# Patient Record
Sex: Male | Born: 1945 | Race: White | Hispanic: No | Marital: Single | Smoking: Former smoker
Health system: Southern US, Community
[De-identification: ages and names within clinical notes are randomized; demographics above are authoritative.]

## PROBLEM LIST (undated history)

## (undated) DIAGNOSIS — J189 Pneumonia, unspecified organism: Secondary | ICD-10-CM

## (undated) DIAGNOSIS — N179 Acute kidney failure, unspecified: Secondary | ICD-10-CM

## (undated) DIAGNOSIS — I10 Essential (primary) hypertension: Secondary | ICD-10-CM

## (undated) DIAGNOSIS — I4891 Unspecified atrial fibrillation: Secondary | ICD-10-CM

## (undated) DIAGNOSIS — Z5189 Encounter for other specified aftercare: Secondary | ICD-10-CM

## (undated) HISTORY — PX: APPENDECTOMY: SHX54

---

## 2016-10-24 ENCOUNTER — Inpatient Hospital Stay (HOSPITAL_COMMUNITY)
Admission: EM | Admit: 2016-10-24 | Discharge: 2016-10-28 | DRG: 291 | Disposition: A | Discharge status: Home / Self Care | Payer: Medicare Other | Attending: Internal Medicine | Admitting: Internal Medicine

## 2016-10-24 ENCOUNTER — Encounter (HOSPITAL_COMMUNITY): Payer: Self-pay | Admitting: Emergency Medicine

## 2016-10-24 DIAGNOSIS — N183 Chronic kidney disease, stage 3 unspecified: Secondary | ICD-10-CM | POA: Diagnosis present

## 2016-10-24 DIAGNOSIS — E039 Hypothyroidism, unspecified: Secondary | ICD-10-CM | POA: Diagnosis present

## 2016-10-24 DIAGNOSIS — G629 Polyneuropathy, unspecified: Secondary | ICD-10-CM | POA: Diagnosis present

## 2016-10-24 DIAGNOSIS — I509 Heart failure, unspecified: Secondary | ICD-10-CM

## 2016-10-24 DIAGNOSIS — Z6833 Body mass index (BMI) 33.0-33.9, adult: Secondary | ICD-10-CM

## 2016-10-24 DIAGNOSIS — N400 Enlarged prostate without lower urinary tract symptoms: Secondary | ICD-10-CM | POA: Diagnosis present

## 2016-10-24 DIAGNOSIS — E669 Obesity, unspecified: Secondary | ICD-10-CM | POA: Diagnosis present

## 2016-10-24 DIAGNOSIS — I080 Rheumatic disorders of both mitral and aortic valves: Secondary | ICD-10-CM | POA: Diagnosis present

## 2016-10-24 DIAGNOSIS — I130 Hypertensive heart and chronic kidney disease with heart failure and stage 1 through stage 4 chronic kidney disease, or unspecified chronic kidney disease: Secondary | ICD-10-CM | POA: Diagnosis not present

## 2016-10-24 DIAGNOSIS — R5381 Other malaise: Secondary | ICD-10-CM | POA: Diagnosis present

## 2016-10-24 DIAGNOSIS — Z882 Allergy status to sulfonamides status: Secondary | ICD-10-CM

## 2016-10-24 DIAGNOSIS — I4891 Unspecified atrial fibrillation: Secondary | ICD-10-CM | POA: Diagnosis present

## 2016-10-24 DIAGNOSIS — Z79899 Other long term (current) drug therapy: Secondary | ICD-10-CM

## 2016-10-24 DIAGNOSIS — K219 Gastro-esophageal reflux disease without esophagitis: Secondary | ICD-10-CM | POA: Diagnosis present

## 2016-10-24 DIAGNOSIS — I480 Paroxysmal atrial fibrillation: Secondary | ICD-10-CM | POA: Diagnosis present

## 2016-10-24 DIAGNOSIS — D649 Anemia, unspecified: Secondary | ICD-10-CM | POA: Diagnosis present

## 2016-10-24 DIAGNOSIS — Z888 Allergy status to other drugs, medicaments and biological substances status: Secondary | ICD-10-CM

## 2016-10-24 DIAGNOSIS — A0472 Enterocolitis due to Clostridium difficile, not specified as recurrent: Secondary | ICD-10-CM | POA: Diagnosis present

## 2016-10-24 DIAGNOSIS — Z7901 Long term (current) use of anticoagulants: Secondary | ICD-10-CM

## 2016-10-24 DIAGNOSIS — N179 Acute kidney failure, unspecified: Secondary | ICD-10-CM | POA: Diagnosis present

## 2016-10-24 DIAGNOSIS — I10 Essential (primary) hypertension: Secondary | ICD-10-CM | POA: Diagnosis present

## 2016-10-24 DIAGNOSIS — I272 Pulmonary hypertension, unspecified: Secondary | ICD-10-CM | POA: Diagnosis present

## 2016-10-24 DIAGNOSIS — I5031 Acute diastolic (congestive) heart failure: Secondary | ICD-10-CM | POA: Diagnosis present

## 2016-10-24 DIAGNOSIS — D509 Iron deficiency anemia, unspecified: Secondary | ICD-10-CM | POA: Diagnosis present

## 2016-10-24 HISTORY — DX: Encounter for other specified aftercare: Z51.89

## 2016-10-24 HISTORY — DX: Essential (primary) hypertension: I10

## 2016-10-24 HISTORY — DX: Unspecified atrial fibrillation: I48.91

## 2016-10-24 HISTORY — DX: Pneumonia, unspecified organism: J18.9

## 2016-10-24 HISTORY — DX: Acute kidney failure, unspecified: N17.9

## 2016-10-24 MED ORDER — ALBUTEROL SULFATE (2.5 MG/3ML) 0.083% IN NEBU: 5.0000 mg | INHALATION_SOLUTION | Freq: Once | RESPIRATORY_TRACT | Status: DC

## 2016-10-24 NOTE — ED Triage Notes (Signed)
Patient is complaining of shortness of breathe. Patient just got out of rehab on 09/17 from having pneumonia. Patient also has a hx of afib. Patient started having shortness of breathe last night and gotten worse over the course of the day.

## 2016-10-25 ENCOUNTER — Emergency Department (HOSPITAL_COMMUNITY): Payer: Medicare Other

## 2016-10-25 ENCOUNTER — Inpatient Hospital Stay (HOSPITAL_COMMUNITY): Payer: Medicare Other

## 2016-10-25 ENCOUNTER — Encounter (HOSPITAL_COMMUNITY): Payer: Self-pay | Admitting: Oncology

## 2016-10-25 DIAGNOSIS — I130 Hypertensive heart and chronic kidney disease with heart failure and stage 1 through stage 4 chronic kidney disease, or unspecified chronic kidney disease: Secondary | ICD-10-CM | POA: Diagnosis present

## 2016-10-25 DIAGNOSIS — E039 Hypothyroidism, unspecified: Secondary | ICD-10-CM | POA: Diagnosis present

## 2016-10-25 DIAGNOSIS — D649 Anemia, unspecified: Secondary | ICD-10-CM | POA: Diagnosis present

## 2016-10-25 DIAGNOSIS — E669 Obesity, unspecified: Secondary | ICD-10-CM | POA: Diagnosis present

## 2016-10-25 DIAGNOSIS — N179 Acute kidney failure, unspecified: Secondary | ICD-10-CM | POA: Diagnosis present

## 2016-10-25 DIAGNOSIS — N183 Chronic kidney disease, stage 3 unspecified: Secondary | ICD-10-CM | POA: Diagnosis present

## 2016-10-25 DIAGNOSIS — E038 Other specified hypothyroidism: Secondary | ICD-10-CM | POA: Diagnosis not present

## 2016-10-25 DIAGNOSIS — I5031 Acute diastolic (congestive) heart failure: Secondary | ICD-10-CM | POA: Diagnosis not present

## 2016-10-25 DIAGNOSIS — Z888 Allergy status to other drugs, medicaments and biological substances status: Secondary | ICD-10-CM | POA: Diagnosis not present

## 2016-10-25 DIAGNOSIS — Z79899 Other long term (current) drug therapy: Secondary | ICD-10-CM | POA: Diagnosis not present

## 2016-10-25 DIAGNOSIS — I4891 Unspecified atrial fibrillation: Secondary | ICD-10-CM | POA: Diagnosis not present

## 2016-10-25 DIAGNOSIS — I361 Nonrheumatic tricuspid (valve) insufficiency: Secondary | ICD-10-CM

## 2016-10-25 DIAGNOSIS — G629 Polyneuropathy, unspecified: Secondary | ICD-10-CM

## 2016-10-25 DIAGNOSIS — R5381 Other malaise: Secondary | ICD-10-CM | POA: Diagnosis present

## 2016-10-25 DIAGNOSIS — Z882 Allergy status to sulfonamides status: Secondary | ICD-10-CM | POA: Diagnosis not present

## 2016-10-25 DIAGNOSIS — I080 Rheumatic disorders of both mitral and aortic valves: Secondary | ICD-10-CM | POA: Diagnosis present

## 2016-10-25 DIAGNOSIS — I10 Essential (primary) hypertension: Secondary | ICD-10-CM | POA: Diagnosis present

## 2016-10-25 DIAGNOSIS — I480 Paroxysmal atrial fibrillation: Secondary | ICD-10-CM | POA: Diagnosis present

## 2016-10-25 DIAGNOSIS — I509 Heart failure, unspecified: Secondary | ICD-10-CM

## 2016-10-25 DIAGNOSIS — A0472 Enterocolitis due to Clostridium difficile, not specified as recurrent: Secondary | ICD-10-CM | POA: Diagnosis present

## 2016-10-25 DIAGNOSIS — D509 Iron deficiency anemia, unspecified: Secondary | ICD-10-CM | POA: Diagnosis present

## 2016-10-25 DIAGNOSIS — K219 Gastro-esophageal reflux disease without esophagitis: Secondary | ICD-10-CM | POA: Diagnosis present

## 2016-10-25 DIAGNOSIS — N400 Enlarged prostate without lower urinary tract symptoms: Secondary | ICD-10-CM | POA: Diagnosis present

## 2016-10-25 DIAGNOSIS — Z6833 Body mass index (BMI) 33.0-33.9, adult: Secondary | ICD-10-CM | POA: Diagnosis not present

## 2016-10-25 DIAGNOSIS — Z7901 Long term (current) use of anticoagulants: Secondary | ICD-10-CM | POA: Diagnosis not present

## 2016-10-25 DIAGNOSIS — I272 Pulmonary hypertension, unspecified: Secondary | ICD-10-CM | POA: Diagnosis present

## 2016-10-25 LAB — CBC WITH DIFFERENTIAL/PLATELET
BASOS ABS: 0 10*3/uL (ref 0.0–0.1)
BASOS PCT: 1 %
EOS ABS: 0 10*3/uL (ref 0.0–0.7)
Eosinophils Relative: 0 %
HEMATOCRIT: 25.4 % — AB (ref 39.0–52.0)
Hemoglobin: 8.2 g/dL — ABNORMAL LOW (ref 13.0–17.0)
Lymphocytes Relative: 13 %
Lymphs Abs: 1 10*3/uL (ref 0.7–4.0)
MCH: 29.7 pg (ref 26.0–34.0)
MCHC: 32.3 g/dL (ref 30.0–36.0)
MCV: 92 fL (ref 78.0–100.0)
MONO ABS: 0.7 10*3/uL (ref 0.1–1.0)
MONOS PCT: 9 %
NEUTROS ABS: 5.8 10*3/uL (ref 1.7–7.7)
Neutrophils Relative %: 77 %
PLATELETS: 153 10*3/uL (ref 150–400)
RBC: 2.76 MIL/uL — ABNORMAL LOW (ref 4.22–5.81)
RDW: 16.6 % — AB (ref 11.5–15.5)
WBC: 7.5 10*3/uL (ref 4.0–10.5)

## 2016-10-25 LAB — COMPREHENSIVE METABOLIC PANEL
ALBUMIN: 3.2 g/dL — AB (ref 3.5–5.0)
ALT: 50 U/L (ref 17–63)
ANION GAP: 5 (ref 5–15)
AST: 21 U/L (ref 15–41)
Alkaline Phosphatase: 98 U/L (ref 38–126)
BUN: 32 mg/dL — AB (ref 6–20)
CHLORIDE: 109 mmol/L (ref 101–111)
CO2: 28 mmol/L (ref 22–32)
Calcium: 8 mg/dL — ABNORMAL LOW (ref 8.9–10.3)
Creatinine, Ser: 1.49 mg/dL — ABNORMAL HIGH (ref 0.61–1.24)
GFR calc Af Amer: 53 mL/min — ABNORMAL LOW (ref >60)
GFR calc non Af Amer: 45 mL/min — ABNORMAL LOW (ref >60)
GLUCOSE: 146 mg/dL — AB (ref 65–99)
POTASSIUM: 4.6 mmol/L (ref 3.5–5.1)
SODIUM: 142 mmol/L (ref 135–145)
TOTAL PROTEIN: 7.4 g/dL (ref 6.5–8.1)
Total Bilirubin: 0.7 mg/dL (ref 0.3–1.2)

## 2016-10-25 LAB — MAGNESIUM: MAGNESIUM: 1.8 mg/dL (ref 1.7–2.4)

## 2016-10-25 LAB — I-STAT TROPONIN, ED: TROPONIN I, POC: 0.01 ng/mL (ref 0.00–0.08)

## 2016-10-25 LAB — ECHOCARDIOGRAM COMPLETE
HEIGHTINCHES: 68 in
Weight: 3661.4 oz

## 2016-10-25 LAB — BRAIN NATRIURETIC PEPTIDE: B Natriuretic Peptide: 799 pg/mL — ABNORMAL HIGH (ref 0.0–100.0)

## 2016-10-25 LAB — TSH: TSH: 4.788 u[IU]/mL — AB (ref 0.350–4.500)

## 2016-10-25 LAB — PROTIME-INR
INR: 2.3
INR: 2.46
PROTHROMBIN TIME: 25.1 s — AB (ref 11.4–15.2)
PROTHROMBIN TIME: 26.5 s — AB (ref 11.4–15.2)

## 2016-10-25 LAB — I-STAT CG4 LACTIC ACID, ED: Lactic Acid, Venous: 0.93 mmol/L (ref 0.5–1.9)

## 2016-10-25 LAB — PROCALCITONIN: Procalcitonin: <0.1 ng/mL

## 2016-10-25 MED ORDER — SODIUM CHLORIDE 0.9% FLUSH
3.0000 mL | Freq: Two times a day (BID) | INTRAVENOUS | Status: DC
  Administered 2016-10-25 – 2016-10-28 (×7): 3 mL via INTRAVENOUS

## 2016-10-25 MED ORDER — ZOLPIDEM TARTRATE 5 MG PO TABS
5.0000 mg | ORAL_TABLET | Freq: Once | ORAL | Status: AC
  Administered 2016-10-25: 5 mg via ORAL
  Filled 2016-10-25: qty 1

## 2016-10-25 MED ORDER — LEVOTHYROXINE SODIUM 25 MCG PO TABS: 75.0000 ug | ORAL_TABLET | Freq: Every day | ORAL | Status: DC

## 2016-10-25 MED ORDER — LEVOTHYROXINE SODIUM 50 MCG PO TABS
75.0000 ug | ORAL_TABLET | Freq: Every day | ORAL | Status: AC
  Filled 2016-10-25: qty 1

## 2016-10-25 MED ORDER — SODIUM CHLORIDE 0.9% FLUSH: 3.0000 mL | INTRAVENOUS | Status: DC | PRN

## 2016-10-25 MED ORDER — SODIUM CHLORIDE 0.9 % IV SOLN: 250.0000 mL | INTRAVENOUS | Status: DC | PRN

## 2016-10-25 MED ORDER — FUROSEMIDE 10 MG/ML IJ SOLN
40.0000 mg | Freq: Once | INTRAMUSCULAR | Status: AC
  Administered 2016-10-25: 40 mg via INTRAVENOUS
  Filled 2016-10-25: qty 4

## 2016-10-25 MED ORDER — WARFARIN - PHARMACIST DOSING INPATIENT: Freq: Every day | Status: DC

## 2016-10-25 MED ORDER — ONDANSETRON HCL 4 MG/2ML IJ SOLN: 4.0000 mg | Freq: Four times a day (QID) | INTRAMUSCULAR | Status: DC | PRN

## 2016-10-25 MED ORDER — LEVOTHYROXINE SODIUM 50 MCG PO TABS
75.0000 ug | ORAL_TABLET | Freq: Once | ORAL | Status: AC
  Administered 2016-10-25: 12:00:00 75 ug via ORAL

## 2016-10-25 MED ORDER — METOPROLOL TARTRATE 50 MG PO TABS
100.0000 mg | ORAL_TABLET | Freq: Two times a day (BID) | ORAL | Status: DC
  Administered 2016-10-25 – 2016-10-28 (×7): 100 mg via ORAL
  Filled 2016-10-25 (×8): qty 2

## 2016-10-25 MED ORDER — ZOLPIDEM TARTRATE 5 MG PO TABS
5.0000 mg | ORAL_TABLET | Freq: Every day | ORAL | Status: DC
  Administered 2016-10-25 – 2016-10-27 (×3): 5 mg via ORAL
  Filled 2016-10-25 (×3): qty 1

## 2016-10-25 MED ORDER — GABAPENTIN 100 MG PO CAPS
100.0000 mg | ORAL_CAPSULE | Freq: Three times a day (TID) | ORAL | Status: DC
  Administered 2016-10-25 – 2016-10-28 (×10): 100 mg via ORAL
  Filled 2016-10-25 (×10): qty 1

## 2016-10-25 MED ORDER — FERROUS SULFATE 325 (65 FE) MG PO TABS
325.0000 mg | ORAL_TABLET | ORAL | Status: DC
  Administered 2016-10-26: 325 mg via ORAL
  Filled 2016-10-25: qty 1

## 2016-10-25 MED ORDER — WARFARIN SODIUM 5 MG PO TABS
7.5000 mg | ORAL_TABLET | Freq: Once | ORAL | Status: AC
  Administered 2016-10-25: 7.5 mg via ORAL
  Filled 2016-10-25: qty 1

## 2016-10-25 MED ORDER — POTASSIUM CHLORIDE CRYS ER 20 MEQ PO TBCR
20.0000 meq | EXTENDED_RELEASE_TABLET | Freq: Two times a day (BID) | ORAL | Status: DC
  Administered 2016-10-25 – 2016-10-28 (×7): 20 meq via ORAL
  Filled 2016-10-25 (×7): qty 1

## 2016-10-25 MED ORDER — LEVOTHYROXINE SODIUM 50 MCG PO TABS
75.0000 ug | ORAL_TABLET | Freq: Every day | ORAL | Status: DC
  Administered 2016-10-25: 10:00:00 75 ug via ORAL
  Filled 2016-10-25 (×2): qty 1

## 2016-10-25 MED ORDER — LEVOTHYROXINE SODIUM 50 MCG PO TABS: 75.0000 ug | ORAL_TABLET | Freq: Every day | ORAL | Status: DC

## 2016-10-25 MED ORDER — RISAQUAD PO CAPS
1.0000 | ORAL_CAPSULE | Freq: Every day | ORAL | Status: DC
  Administered 2016-10-25 – 2016-10-28 (×4): 1 via ORAL
  Filled 2016-10-25 (×4): qty 1

## 2016-10-25 MED ORDER — FUROSEMIDE 10 MG/ML IJ SOLN
40.0000 mg | Freq: Two times a day (BID) | INTRAMUSCULAR | Status: DC
  Administered 2016-10-25 (×2): 40 mg via INTRAVENOUS
  Filled 2016-10-25 (×2): qty 4

## 2016-10-25 MED ORDER — ACETAMINOPHEN 325 MG PO TABS: 650.0000 mg | ORAL_TABLET | ORAL | Status: DC | PRN

## 2016-10-25 MED ORDER — PANTOPRAZOLE SODIUM 40 MG PO TBEC
40.0000 mg | DELAYED_RELEASE_TABLET | Freq: Every day | ORAL | Status: DC
  Administered 2016-10-26 – 2016-10-28 (×3): 40 mg via ORAL
  Filled 2016-10-25 (×4): qty 1

## 2016-10-25 MED ORDER — LEVOTHYROXINE SODIUM 50 MCG PO TABS
75.0000 ug | ORAL_TABLET | Freq: Every day | ORAL | Status: DC
  Administered 2016-10-26 – 2016-10-28 (×3): 75 ug via ORAL
  Filled 2016-10-25 (×3): qty 1

## 2016-10-25 NOTE — ED Provider Notes (Addendum)
[facility]-EMERGENCY DEPT Provider Note   CSN: [id number] Arrival date & time: 10/24/16  2305     History   Chief Complaint Chief Complaint  Patient presents with  . Shortness of Breath    HPI Alexander Camacho is a 71 y.o. male.  Patient presents to the emergency department for evaluation of shortness of breath.  Patient reports that he was recently discharged from rehab.  He went to rehab after a prolonged hospitalization for pneumonia that was complicated by atrial fibrillation and renal failure requiring dialysis.  Patient reports that his renal function significantly improved during rehab stay, but since leaving rehab he has been noticing increased swelling of his legs to go with his shortness of breath.  He has not had fever or chest pain.      Past Medical History:  Diagnosis Date  . Acute renal failure (HCC)   . Atrial fibrillation (HCC)   . Blood transfusion without reported diagnosis   . Hypertension   . Pneumonia     Patient Active Problem List   Diagnosis Date Noted  . CHF (congestive heart failure) (HCC) 10/25/2016    Past Surgical History:  Procedure Laterality Date  . APPENDECTOMY         Home Medications    Prior to Admission medications   Medication Sig Start Date End Date Taking? Authorizing Provider  acidophilus (RISAQUAD) CAPS capsule Take 1 capsule by mouth daily.   Yes [provider]  amLODipine (NORVASC) 5 MG tablet Take 5 mg by mouth daily.   Yes [provider]  doxazosin (CARDURA) 4 MG tablet Take 4 mg by mouth at bedtime.   Yes [provider]  ferrous sulfate 325 (65 FE) MG tablet Take 325 mg by mouth every Monday, Wednesday, Friday, and Saturday at 6 PM.   Yes [provider]  gabapentin (NEURONTIN) 100 MG capsule Take 100 mg by mouth 3 (three) times daily.   Yes [provider]  levothyroxine (SYNTHROID, LEVOTHROID) 75 MCG tablet Take 75 mcg by mouth daily  before breakfast.   Yes [provider]  metoprolol tartrate (LOPRESSOR) 100 MG tablet Take 100 mg by mouth 2 (two) times daily.   Yes [provider]  pantoprazole (PROTONIX) 40 MG tablet Take 40 mg by mouth daily.   Yes [provider]  polyethylene glycol (MIRALAX / GLYCOLAX) packet Take 17 g by mouth daily as needed for moderate constipation.   Yes [provider]  warfarin (COUMADIN) 7.5 MG tablet Take 7.5 mg by mouth daily at 6 PM.   Yes [provider]  zolpidem (AMBIEN) 10 MG tablet Take 10 mg by mouth at bedtime.   Yes [provider]    Family History History reviewed. No pertinent family history.  Social History Social History  Substance Use Topics  . Smoking status: Never Smoker  . Smokeless tobacco: Never Used  . Alcohol use No     Allergies   Lorazepam and Sulfa antibiotics   Review of Systems Review of Systems  Respiratory: Positive for shortness of breath.   Cardiovascular: Positive for leg swelling.  All other systems reviewed and are negative.    Physical Exam Updated Vital Signs BP (!) 146/84 (BP Location: Right Arm)   Pulse 100   Temp 98.6 F (37 C) (Oral)   Resp (!) 24   Ht 5\' 8"  (1.727 m)   Wt 106.6 kg (235 lb)   SpO2 93%   BMI 35.73 kg/m  Physical Exam  Constitutional: He is oriented to person, place, and time. He appears well-developed and well-nourished. No distress.  HENT:  Head: Normocephalic and atraumatic.  Right Ear: Hearing normal.  Left Ear: Hearing normal.  Nose: Nose normal.  Mouth/Throat: Oropharynx is clear and moist and mucous membranes are normal.  Eyes: Pupils are equal, round, and reactive to light. Conjunctivae and EOM are normal.  Neck: Normal range of motion. Neck supple.  Cardiovascular: Regular rhythm, S1 normal and S2 normal.  Exam reveals no gallop and no friction rub.   No murmur heard. Pulmonary/Chest: Effort normal. No respiratory distress. He has rales. He  exhibits no tenderness.  Abdominal: Soft. Normal appearance and bowel sounds are normal. There is no hepatosplenomegaly. There is no tenderness. There is no rebound, no guarding, no tenderness at McBurney's point and negative Murphy's sign. No hernia.  Musculoskeletal: Normal range of motion. He exhibits edema.  Neurological: He is alert and oriented to person, place, and time. He has normal strength. No cranial nerve deficit or sensory deficit. Coordination normal. GCS eye subscore is 4. GCS verbal subscore is 5. GCS motor subscore is 6.  Skin: Skin is warm, dry and intact. No rash noted. No cyanosis.  Psychiatric: He has a normal mood and affect. His speech is normal and behavior is normal. Thought content normal.  Nursing note and vitals reviewed.    ED Treatments / Results  Labs (all labs ordered are listed, but only abnormal results are displayed) Labs Reviewed  CBC WITH DIFFERENTIAL/PLATELET - Abnormal; Notable for the following:       Result Value   RBC 2.76 (*)    Hemoglobin 8.2 (*)    HCT 25.4 (*)    RDW 16.6 (*)    All other components within normal limits  COMPREHENSIVE METABOLIC PANEL - Abnormal; Notable for the following:    Glucose, Bld 146 (*)    BUN 32 (*)    Creatinine, Ser 1.49 (*)    Calcium 8.0 (*)    Albumin 3.2 (*)    GFR calc non Af Amer 45 (*)    GFR calc Af Amer 53 (*)    All other components within normal limits  BRAIN NATRIURETIC PEPTIDE - Abnormal; Notable for the following:    B Natriuretic Peptide 799.0 (*)    All other components within normal limits  PROTIME-INR - Abnormal; Notable for the following:    Prothrombin Time 25.1 (*)    All other components within normal limits  PROTIME-INR  I-STAT CG4 LACTIC ACID, ED  I-STAT TROPONIN, ED    EKG  EKG Interpretation  Date/Time:  Tuesday October 25 2016 00:18:58 EDT Ventricular Rate:  98 PR Interval:    QRS Duration: 87 QT Interval:  342 QTC Calculation: 423 R Axis:   0 Text Interpretation:   Atrial fibrillation Short PR interval Baseline wander in lead(s) I III aVL Confirmed by Gilda Creaseollina, Kaylinn Dedic J [phone]([id number]) on 10/25/2016 1:35:17 AM       Radiology Dg Chest 2 View  Result Date: 10/25/2016 CLINICAL DATA:  71 year old male with shortness of breath. EXAM: CHEST  2 VIEW COMPARISON:  None. FINDINGS: There are small bilateral pleural effusions, right greater than left. There is associated partial compressive atelectasis of the lower lobes. Superimposed pneumonia is not excluded. Clinical correlation is recommended. There is no pneumothorax. There is cardiomegaly. There is silhouetting of the cardiac border by the pleuroparenchymal densities. No acute osseous pathology. IMPRESSION: Bilateral pleural effusions and bilateral lower lobe atelectatic changes  versus infiltrate. Clinical correlation is recommended. Electronically Signed   By: Elgie Collard M.D.   On: 10/25/2016 00:30    Procedures Procedures (including critical care time)  Medications Ordered in ED Medications  albuterol (PROVENTIL) (2.5 MG/3ML) 0.083% nebulizer solution 5 mg (0 mg Nebulization Hold 10/25/16 0202)  levothyroxine (SYNTHROID, LEVOTHROID) tablet 75 mcg (not administered)  metoprolol tartrate (LOPRESSOR) tablet 100 mg (not administered)  warfarin (COUMADIN) tablet 7.5 mg (not administered)  Warfarin - Pharmacist Dosing Inpatient (not administered)  furosemide (LASIX) injection 40 mg (40 mg Intravenous Given 10/25/16 0340)     Initial Impression / Assessment and Plan / ED Course  I have reviewed the triage vital signs and the nursing notes.  Pertinent labs & imaging results that were available during my care of the patient were reviewed by me and considered in my medical decision making (see chart for details).     Patient presents to the emergency department for evaluation of shortness of breath.  Patient was just moved to the area after leaving rehab after a lengthy hospitalization in [place].   Patient was hospitalized for pneumonia, had a prolonged hospital course.  He was intubated for a period of time, had complications including atrial fibrillation and renal failure requiring dialysis.  Patient went to rehab after hospitalization and was just discharged.  During his rehab stay his renal failure significantly improved.  He has, however, been experiencing progressively increasing extremity edema since discharge from the hospital.  His workup today is consistent with congestive heart failure.  Patient administered IV Lasix here in the ER and will require hospitalization for further management.  Final Clinical Impressions(s) / ED Diagnoses   Final diagnoses:  Acute congestive heart failure, unspecified heart failure type [facility])    New Prescriptions New Prescriptions   No medications on file     Gilda Crease, MD 10/25/16 0502    Gilda Crease, MD 11/04/16 0400

## 2016-10-25 NOTE — Progress Notes (Signed)
ANTICOAGULATION CONSULT NOTE - Initial Consult  Pharmacy Consult for Warfarin Indication: atrial fibrillation  Allergies  Allergen Reactions  . Lorazepam Other (See Comments)    Altered mental status, confusion  . Sulfa Antibiotics Anaphylaxis    Patient Measurements: Height: 5\' 8"  (172.7 cm) Weight: 235 lb (106.6 kg) IBW/kg (Calculated) : 68.4  Vital Signs: Temp: 98.6 F (37 C) (10/23 0343) Temp Source: Oral (10/23 0343) BP: 146/84 (10/23 0343) Pulse Rate: 100 (10/23 0343)  Labs:  Recent Labs  10/25/16 0030 10/25/16 0033  HGB 8.2*  --   HCT 25.4*  --   PLT 153  --   LABPROT  --  25.1*  INR  --  2.30  CREATININE 1.49*  --     Estimated Creatinine Clearance: 53.8 mL/min (A) (by C-G formula based on SCr of 1.49 mg/dL (H)).   Medical History: Past Medical History:  Diagnosis Date  . Acute renal failure (HCC)   . Atrial fibrillation (HCC)   . Blood transfusion without reported diagnosis   . Hypertension   . Pneumonia     Medications:  Scheduled:  . albuterol  5 mg Nebulization Once  . levothyroxine  75 mcg Oral QAC breakfast  . metoprolol tartrate  100 mg Oral BID   Infusions:   PRN:   Assessment: 71 year old male on chronic warfarin for afib admitted with shortness of breath and lower extremity swelling.  Home dose of warfarin reported as 7.5mg  daily, INR on admission therapeutic at 2.3.  Pharmacy is consulted to continue dosing while inpatient.  Goal of Therapy:  INR 2-3 Monitor platelets by anticoagulation protocol: Yes   Plan:   Warfarin 7.5mg  PO x 1 tonight as per home regimen  Daily PT/INR  Loralee PacasErin Simona Rocque, PharmD, BCPS Pager: [phone] 10/25/2016,3:47 AM

## 2016-10-25 NOTE — Plan of Care (Signed)
Discuss case with Dr. Blinda LeatherwoodPollina. 71 year old male with pmh of HTN, atrial fibrillation on coumadin, and hypothyroidism; who presents with complaints of shortness of breath and lower extremity swelling. [place] with ARF requiring ICU stay with intubation and hemodialysis. Sent to rehabilitation and [facility]. On admission found to have BNP 799, hemoglobin 8.2(unchanged), BUN 32, creatinine 1.49. Chest x-ray with bilateral pleural effusions. Patient was given 40 mg of Lasix IV in the ED. Admitted to a telemetry bed as inpatient.

## 2016-10-25 NOTE — Progress Notes (Signed)
  Echocardiogram 2D Echocardiogram has been performed.  Jurney Overacker T Brenner Visconti 10/25/2016, 12:57 PM

## 2016-10-25 NOTE — H&P (Signed)
History and Physical    Alexander Camacho ONG:[id number] DOB: [birth year] DOA: 10/24/2016   PCP: Patient, No Pcp Per -daughter to bring in name of patient's new PCP/has not established yet  Attending physician: Dhungel  Patient coming from/Resides with: Private residence/recently moved to the area and is now living with his daughter  Chief Complaint: Shortness of breath  HPI: Alexander Camacho is a 71 y.o. male with medical history significant for hypertension, hypothyroidism, BPH who presents to this facility for shortness of breath.  Of note patient recently discharged from rehab facility after a protracted hospitalization in [place] related to pneumonia complicated by atrial fibrillation/RVR, acute renal failure requiring short-term dialysis.  Prior heart failure.  History has been obtained from the patient and the daughter.  Daughter reports that while in rehab patient began developing significant lower extremity edema.  At time of discharge from rehab patient's weight was 235 pounds.  Preadmission weight unknown.  The past several days after arriving to [facility] patient has developed significant shortness of breath and dyspnea on exertion.  He has limited exercise tolerance.  He ambulates with a rolling walker since rehab.  Evaluation in the ER revealed elevated BNP and abnormal chest x-ray with edema and bilateral pleural effusions.  He has been given Lasix 40 mg IV x1 in the ER and has diuresed 1600 cc since that initial dose.  ED Course:  Vital Signs: BP (!) 146/88 (BP Location: Right Arm)   Pulse (!) 107   Temp 98.4 F (36.9 C) (Oral)   Resp (!) 22   Ht 5\' 8"  (1.727 m)   Wt 103.8 kg (228 lb 13.4 oz)   SpO2 96%   BMI 34.79 kg/m  2 view CXR: Lateral pleural effusions and bilateral lower lobe atelectatic changes versus infiltrate Lab data: Sodium 142, potassium 4.6, chloride 109, CO2 28, glucose 146, BUN 32, creatinine 1.49, anion gap 5, LFTs not elevated, BNP 799, poc  troponin 0 0.01, lactic acid 0.93, pro calcitonin <0.10, white count 7500 with normal differential, hemoglobin 8.2, platelets 153,000, INR 2.46 Medications and treatments: Lasix 40 mg IV x1  Review of Systems:  In addition to the HPI above,  No Fever-chills, myalgias or other constitutional symptoms No Headache, changes with Vision or hearing, new weakness, tingling, numbness in any extremity, dizziness, dysarthria or word finding difficulty, tremors or seizure activity No problems swallowing food or Liquids, indigestion/reflux, choking or coughing while eating, abdominal pain with or after eating No Chest pain, Cough; historically patient denies awareness of tachypalpitations No Abdominal pain, N/V, melena,hematochezia, dark tarry stools, constipation No dysuria, malodorous urine, hematuria or flank pain No new skin rashes, lesions, masses or bruises, No new joint pains, aches, swelling or redness No recent unintentional weight loss No polyuria, polydypsia or polyphagia   Past Medical History:  Diagnosis Date  . Acute renal failure (HCC)   . Atrial fibrillation (HCC)   . Blood transfusion without reported diagnosis   . Hypertension   . Pneumonia     Past Surgical History:  Procedure Laterality Date  . APPENDECTOMY      Social History   Social History  . Marital status: Single    Spouse name: N/A  . Number of children: N/A  . Years of education: N/A   Occupational History  . Not on file.   Social History Main Topics  . Smoking status: Never Smoker  . Smokeless tobacco: Never Used  . Alcohol use No  . Drug use: No  .  Sexual activity: Not on file   Other Topics Concern  . Not on file   Social History Narrative  . No narrative on file    Mobility: RW Work history: Until recent hospitalization in [place] patient was employed as a Music therapist was independent   Allergies  Allergen Reactions  . Lorazepam Other (See Comments)    Altered mental status, confusion    . Sulfa Antibiotics Anaphylaxis    Family history reviewed and not pertinent to current admission findings or diagnosis  Prior to Admission medications   Medication Sig Start Date End Date Taking? Authorizing Provider  acidophilus (RISAQUAD) CAPS capsule Take 1 capsule by mouth daily.   Yes [provider]  amLODipine (NORVASC) 5 MG tablet Take 5 mg by mouth daily.   Yes [provider]  doxazosin (CARDURA) 4 MG tablet Take 4 mg by mouth at bedtime.   Yes [provider]  ferrous sulfate 325 (65 FE) MG tablet Take 325 mg by mouth every Monday, Wednesday, Friday, and Saturday at 6 PM.   Yes [provider]  gabapentin (NEURONTIN) 100 MG capsule Take 100 mg by mouth 3 (three) times daily.   Yes [provider]  levothyroxine (SYNTHROID, LEVOTHROID) 75 MCG tablet Take 75 mcg by mouth daily before breakfast.   Yes [provider]  metoprolol tartrate (LOPRESSOR) 100 MG tablet Take 100 mg by mouth 2 (two) times daily.   Yes [provider]  pantoprazole (PROTONIX) 40 MG tablet Take 40 mg by mouth daily.   Yes [provider]  polyethylene glycol (MIRALAX / GLYCOLAX) packet Take 17 g by mouth daily as needed for moderate constipation.   Yes [provider]  warfarin (COUMADIN) 7.5 MG tablet Take 7.5 mg by mouth daily at 6 PM.   Yes [provider]  zolpidem (AMBIEN) 10 MG tablet Take 10 mg by mouth at bedtime.   Yes [provider]    Physical Exam: Vitals:   10/25/16 0212 10/25/16 0249 10/25/16 0343 10/25/16 0639  BP: 138/83 116/88 (!) 146/84 (!) 146/88  Pulse: (!) 113 94 100 (!) 107  Resp: (!) 27 (!) 27 (!) 24 (!) 22  Temp:   98.6 F (37 C) 98.4 F (36.9 C)  TempSrc:   Oral Oral  SpO2: 90% 91% 93% 96%  Weight:    103.8 kg (228 lb 13.4 oz)  Height:    5\' 8"  (1.727 m)      Constitutional: NAD, calm, comfortable Eyes: PERRL, lids and conjunctivae normal ENMT: Mucous membranes are  moist. Posterior pharynx clear of any exudate or lesions.Normal dentition.  Neck: normal, supple, no masses, no thyromegaly Respiratory: Diminished in bilateral bases with fine expiratory crackles mid fields, normal respiratory effort. No accessory muscle use.  Cardiovascular: Irregular rate with underlying atrial fibrillation rhythm, no murmurs / rubs / gallops.  2-3+ bilateral lower extremity edema. 2+ pedal pulses. No carotid bruits.  5 cm JVD Abdomen: no tenderness, no masses palpated. No hepatosplenomegaly. Bowel sounds positive.  Musculoskeletal: no clubbing / cyanosis. No joint deformity upper and lower extremities. Good ROM, no contractures. Normal muscle tone.  Skin: no rashes, lesions, ulcers. No induration Neurologic: CN 2-12 grossly intact. Sensation intact, DTR normal. Strength 5/5 x all 4 extremities.  Psychiatric: Normal judgment and insight. Alert and oriented x 3. Normal mood.    Labs on Admission: I have personally reviewed following labs and imaging studies  CBC:  Recent Labs Lab 10/25/16 0030  WBC 7.5  NEUTROABS 5.8  HGB 8.2*  HCT 25.4*  MCV 92.0  PLT 153   Basic Metabolic Panel:  Recent Labs Lab 10/25/16 0030 10/25/16 0841  NA 142  --   K 4.6  --   CL 109  --   CO2 28  --   GLUCOSE 146*  --   BUN 32*  --   CREATININE 1.49*  --   CALCIUM 8.0*  --   MG  --  1.8   GFR: Estimated Creatinine Clearance: 53.1 mL/min (A) (by C-G formula based on SCr of 1.49 mg/dL (H)). Liver Function Tests:  Recent Labs Lab 10/25/16 0030  AST 21  ALT 50  ALKPHOS 98  BILITOT 0.7  PROT 7.4  ALBUMIN 3.2*   No results for input(s): LIPASE, AMYLASE in the last 168 hours. No results for input(s): AMMONIA in the last 168 hours. Coagulation Profile:  Recent Labs Lab 10/25/16 0033 10/25/16 0456  INR 2.30 2.46   Cardiac Enzymes: No results for input(s): CKTOTAL, CKMB, CKMBINDEX, TROPONINI in the last 168 hours. BNP (last 3 results) No results for input(s): PROBNP  in the last 8760 hours. HbA1C: No results for input(s): HGBA1C in the last 72 hours. CBG: No results for input(s): GLUCAP in the last 168 hours. Lipid Profile: No results for input(s): CHOL, HDL, LDLCALC, TRIG, CHOLHDL, LDLDIRECT in the last 72 hours. Thyroid Function Tests:  Recent Labs  10/25/16 0841  TSH 4.788*   Anemia Panel: No results for input(s): VITAMINB12, FOLATE, FERRITIN, TIBC, IRON, RETICCTPCT in the last 72 hours. Urine analysis: No results found for: COLORURINE, APPEARANCEUR, LABSPEC, PHURINE, GLUCOSEU, HGBUR, BILIRUBINUR, KETONESUR, PROTEINUR, UROBILINOGEN, NITRITE, LEUKOCYTESUR Sepsis Labs: @LABRCNTIP (procalcitonin:4,lacticidven:4) )No results found for this or any previous visit (from the past 240 hour(s)).   Radiological Exams on Admission: Dg Chest 2 View  Result Date: 10/25/2016 CLINICAL DATA:  71 year old male with shortness of breath. EXAM: CHEST  2 VIEW COMPARISON:  None. FINDINGS: There are small bilateral pleural effusions, right greater than left. There is associated partial compressive atelectasis of the lower lobes. Superimposed pneumonia is not excluded. Clinical correlation is recommended. There is no pneumothorax. There is cardiomegaly. There is silhouetting of the cardiac border by the pleuroparenchymal densities. No acute osseous pathology. IMPRESSION: Bilateral pleural effusions and bilateral lower lobe atelectatic changes versus infiltrate. Clinical correlation is recommended. Electronically Signed   By: Elgie CollardArash  Radparvar M.D.   On: 10/25/2016 00:30    EKG: (Independently reviewed) atrial fibrillation with ventricular response 90 bpm, QTC 423 ms, normal R wave rotation, no acute ischemic changes  Assessment/Plan Principal Problem:   CHF (congestive heart failure) bilateral pleural effusions -Patient presents with new onset congestive heart failure with elevated BNP and abnormal chest x-ray -Echocardiogram -Lasix 40 mg IV every 12 hours -No ACE  inhibitor/ARB secondary to CKD -Continue beta-blocker -Daily weights, strict I/O -No awareness of tachypalpitations and patient reports during previous hospitalization he was told they were having difficulty controlling his heart rate so uncontrolled A. fib could be contributing -May benefit from eventual cardiology consultation-patient has outpatient follow-up scheduled with 1 of the cardiologist at [facility]-daughter to provide name -Hold preadmission doxazosin and Norvasc during acute diuresis -Daily labs  Active Problems:   Atrial fibrillation  -New onset during previous admission -Mostly rate controlled with some mild tachycardia VR less than 110 -Continue new beta-blocker -CHADVASC=3 -[facility] [place] [facility] -Potassium is greater than 4 and magnesium is 1.8 -Pharmacy to manage warfarin-current INR therapeutic    Hypertension -  As above regarding home medications    CKD (chronic kidney disease) stage 3, GFR 30-59 ml/min  -Baseline renal function unknown but current GFR indicates stage III kidney disease -Patient did require emergent hemodialysis for 1 week in the setting of acute kidney injury secondary to sepsis -Follow labs closely with diuresis     Iron deficiency anemia -Baseline hemoglobin unknown -Current hemoglobin 8.2 but given what appears to be significant volume overload chronic low number may be more dilutional than true -Continue to follow trends -Continue preadmission iron supplementation    Hypothyroidism -TSH slightly elevated at 4.788 -Obtain free T4/T3 in a.m.    GERD (gastroesophageal reflux disease) -PPI    ?? Peripheral neuropathy -Continue preadmission Neurontin      Physical deconditioning -Was utilizing physical therapy at home -PT/OT evaluation    DVT prophylaxis: Warfarin Code Status: Full Family Communication: Daughter Disposition Plan: Home Consults called:  *None    ELLIS,ALLISON L. ANP-BC [facility] Pager [phone]   If 7PM-7AM, please contact night-coverage [email or web address] Password [email or web address]  10/25/2016, 10:20 AM

## 2016-10-25 NOTE — Progress Notes (Signed)
Lab not able to obtain Troponin due to poor veins. MD notified.

## 2016-10-25 NOTE — ED Notes (Signed)
Please call Byrd HesselbachMaria, RN at [phone] for report at 0500. Thanks.

## 2016-10-25 NOTE — Progress Notes (Signed)
PT Cancellation Note  Patient Details Name: Alexander Camacho MRN: [id number] DOB: September 11, 1945   Cancelled Treatment:    Reason Eval/Treat Not Completed: Medical issues which prohibited therapy (pt with severe dyspnea, per RN hold PT today. )   Tamala SerUhlenberg, Geraldy Akridge Kistler 10/25/2016, 11:16 AM [phone]

## 2016-10-26 ENCOUNTER — Inpatient Hospital Stay (HOSPITAL_COMMUNITY): Payer: Medicare Other

## 2016-10-26 DIAGNOSIS — I10 Essential (primary) hypertension: Secondary | ICD-10-CM

## 2016-10-26 DIAGNOSIS — I509 Heart failure, unspecified: Secondary | ICD-10-CM

## 2016-10-26 DIAGNOSIS — I4891 Unspecified atrial fibrillation: Secondary | ICD-10-CM

## 2016-10-26 DIAGNOSIS — I5031 Acute diastolic (congestive) heart failure: Secondary | ICD-10-CM

## 2016-10-26 LAB — BASIC METABOLIC PANEL
Anion gap: 8 (ref 5–15)
BUN: 31 mg/dL — AB (ref 6–20)
CHLORIDE: 106 mmol/L (ref 101–111)
CO2: 30 mmol/L (ref 22–32)
CREATININE: 1.49 mg/dL — AB (ref 0.61–1.24)
Calcium: 8.6 mg/dL — ABNORMAL LOW (ref 8.9–10.3)
GFR calc Af Amer: 53 mL/min — ABNORMAL LOW (ref >60)
GFR, EST NON AFRICAN AMERICAN: 45 mL/min — AB (ref >60)
GLUCOSE: 125 mg/dL — AB (ref 65–99)
Potassium: 3.5 mmol/L (ref 3.5–5.1)
Sodium: 144 mmol/L (ref 135–145)

## 2016-10-26 LAB — PROTIME-INR
INR: 2.26
Prothrombin Time: 24.8 seconds — ABNORMAL HIGH (ref 11.4–15.2)

## 2016-10-26 LAB — T4, FREE: FREE T4: 1.11 ng/dL (ref 0.61–1.12)

## 2016-10-26 MED ORDER — DILTIAZEM HCL 60 MG PO TABS
60.0000 mg | ORAL_TABLET | Freq: Three times a day (TID) | ORAL | Status: AC
  Administered 2016-10-26 – 2016-10-27 (×5): 60 mg via ORAL
  Filled 2016-10-26 (×5): qty 1

## 2016-10-26 MED ORDER — FUROSEMIDE 10 MG/ML IJ SOLN
40.0000 mg | Freq: Two times a day (BID) | INTRAMUSCULAR | Status: DC
  Administered 2016-10-26 – 2016-10-27 (×3): 40 mg via INTRAVENOUS
  Filled 2016-10-26 (×3): qty 4

## 2016-10-26 MED ORDER — ZOLPIDEM TARTRATE 5 MG PO TABS
5.0000 mg | ORAL_TABLET | Freq: Once | ORAL | Status: AC
  Administered 2016-10-26: 5 mg via ORAL
  Filled 2016-10-26: qty 1

## 2016-10-26 MED ORDER — WARFARIN SODIUM 5 MG PO TABS
7.5000 mg | ORAL_TABLET | Freq: Every day | ORAL | Status: DC
  Administered 2016-10-26 – 2016-10-27 (×2): 7.5 mg via ORAL
  Filled 2016-10-26 (×2): qty 1

## 2016-10-26 MED ORDER — FUROSEMIDE 40 MG PO TABS: 40.0000 mg | ORAL_TABLET | Freq: Two times a day (BID) | ORAL | Status: DC

## 2016-10-26 NOTE — Progress Notes (Signed)
ANTICOAGULATION CONSULT NOTE Pharmacy Consult for Warfarin Indication: atrial fibrillation  Allergies  Allergen Reactions  . Lorazepam Other (See Comments)    Altered mental status, confusion  . Sulfa Antibiotics Anaphylaxis    Patient Measurements: Height: 5\' 8"  (172.7 cm) Weight: 219 lb 2.2 oz (99.4 kg) IBW/kg (Calculated) : 68.4  Vital Signs: Temp: 98.8 F (37.1 C) (10/24 0510) Temp Source: Oral (10/24 0510) BP: 137/98 (10/24 0515) Pulse Rate: 87 (10/24 0515)  Labs:  Recent Labs  10/25/16 0030 10/25/16 0033 10/25/16 0456 10/26/16 0455  HGB 8.2*  --   --   --   HCT 25.4*  --   --   --   PLT 153  --   --   --   LABPROT  --  25.1* 26.5* 24.8*  INR  --  2.30 2.46 2.26  CREATININE 1.49*  --   --  1.49*    Estimated Creatinine Clearance: 52 mL/min (A) (by C-G formula based on SCr of 1.49 mg/dL (H)).   Assessment: 71 year old male on chronic warfarin for afib admitted with shortness of breath and lower extremity swelling.  Home dose of warfarin reported as 7.5mg  daily, INR on admission therapeutic at 2.3.  Pharmacy is consulted to continue dosing while inpatient.  10/26/2016 INR therapeutic at 2.26. No bleeding reported.   Goal of Therapy:  INR 2-3 Monitor platelets by anticoagulation protocol: Yes   Plan:   Warfarin 7.5mg  daily as per home regimen  Daily PT/INR  Herby AbrahamMichelle T. Emmalena Canny, Pharm.D. [phone] 10/26/2016 8:19 AM

## 2016-10-26 NOTE — Progress Notes (Signed)
Pt's PCP is Dr. Caryn Beeakia Starkes-Perry, [facility]. [phone].

## 2016-10-26 NOTE — Evaluation (Signed)
Occupational Therapy Evaluation Patient Details Name: Alexander Camacho MRN: [id number] DOB: 1945/03/16 Today's Date: 10/26/2016    History of Present Illness 71 year old male with hypertension with recent prolonged hospitalization in [place] with respiratory failure requiring intubation, hospital course prolonged due to rapid A. Fib, acute renal failure secondary to contrast nephropathy (as per daughter) requiring short-term dialysis. He was then discharged to rehabilitation for almost 3 weeks and came to stay with his daughter in [place] 2 days prior to admission. Pt admitted with SOB and BLE swelling. Dx of acute CHF.   Clinical Impression   Pt admitted with SOB and BLE edema. Pt currently with functional limitations due to the deficits listed below (see OT Problem List).  Pt will benefit from skilled OT to increase their safety and independence with ADL and functional mobility for ADL to facilitate discharge to venue listed below.     Follow Up Recommendations  Home health OT    Equipment Recommendations  None recommended by OT       Precautions / Restrictions Precautions Precautions: Other (comment) Precaution Comments: monitor O2 Restrictions Weight Bearing Restrictions: No      Mobility Bed Mobility               General bed mobility comments: up in recliner  Transfers Overall transfer level: Needs assistance Equipment used: Rolling walker (2 wheeled) Transfers: Sit to/from Stand Sit to Stand: Supervision         General transfer comment: Vc for arm placement    Balance Overall balance assessment: Modified Independent                                         ADL either performed or assessed with clinical judgement   ADL Overall ADL's : Needs assistance/impaired Eating/Feeding: Set up;Sitting   Grooming: Set up;Sitting   Upper Body Bathing: Set up;Sitting   Lower Body Bathing: Sit to/from stand;Moderate  assistance   Upper Body Dressing : Set up;Sitting   Lower Body Dressing: Moderate assistance;Sit to/from stand   Toilet Transfer: Minimal assistance;Ambulation;Cueing for sequencing;Cueing for safety;RW   Toileting- Clothing Manipulation and Hygiene: Minimal assistance;Sit to/from stand         General ADL Comments: Energy conservation initiated.  Hand out provided.  Pt fatigues quickly!      Vision Patient Visual Report: No change from baseline              Pertinent Vitals/Pain Pain Assessment: No/denies pain     Hand Dominance     Extremity/Trunk Assessment Upper Extremity Assessment Upper Extremity Assessment: Generalized weakness   Lower Extremity Assessment Lower Extremity Assessment: Overall WFL for tasks assessed (4/5 BLEs grossly; pt reports BLEs are weaker than at baseline, he has trouble lifting LEs for getting up stairs and for driving, pt able to actively flex B hips against gravity in sitting)   Cervical / Trunk Assessment Cervical / Trunk Assessment: Normal   Communication Communication Communication: HOH (hears better out of L ear)   Cognition Arousal/Alertness: Awake/alert Behavior During Therapy: WFL for tasks assessed/performed Overall Cognitive Status: Within Functional Limits for tasks assessed                                                Home  Living Family/patient expects to be discharged to:: Private residence Living Arrangements: Children Available Help at Discharge: Family;Available 24 hours/day Type of Home: House Home Access: Stairs to enter [employer] of Steps: 4 Entrance Stairs-Rails: Right;Left;Can reach both Home Layout: One level     Bathroom Shower/Tub: Walk-in shower         Home Equipment: Environmental consultant - 4 wheels;Shower seat;Adaptive equipment Adaptive Equipment: Reacher        Prior Functioning/Environment Level of Independence: Independent with assistive device(s)        Comments:  walked with rollator, dressing lower body labored but independent with reacher        OT Problem List: Decreased strength;Decreased activity tolerance;Decreased knowledge of use of DME or AE;Decreased safety awareness      OT Treatment/Interventions: Self-care/ADL training;DME and/or AE instruction;Patient/family education    OT Goals(Current goals can be found in the care plan section) Acute Rehab OT Goals Patient Stated Goal: 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 OT Goal Formulation: With patient Time For Goal Achievement: 11/02/16  OT Frequency: Min 2X/week   Barriers to D/C:               AM-PAC PT "6 Clicks" Daily Activity     Outcome Measure Help from another person eating meals?: None Help from another person taking care of personal grooming?: A Little Help from another person toileting, which includes using toliet, bedpan, or urinal?: A Little Help from another person bathing (including washing, rinsing, drying)?: A Little Help from another person to put on and taking off regular upper body clothing?: A Little Help from another person to put on and taking off regular lower body clothing?: A Little 6 Click Score: 19   End of Session Equipment Utilized During Treatment: Rolling walker Nurse Communication: Mobility status  Activity Tolerance: Patient tolerated treatment well Patient left: in chair  OT Visit Diagnosis: Unsteadiness on feet (R26.81);Muscle weakness (generalized) (M62.81)                Time: 1337-1350 OT Time Calculation (min): 13 min Charges:  OT General Charges $OT Visit: 1 Visit OT Evaluation $OT Eval Moderate Complexity: 1 Mod G-Codes:     Lise Auer, [place] [phone]  Einar Crow D 10/26/2016, 2:50 PM

## 2016-10-26 NOTE — Progress Notes (Signed)
SATURATION QUALIFICATIONS: (This note is used to comply with regulatory documentation for home oxygen)  Patient Saturations on Room Air at Rest = 94%  Patient Saturations on Room Air while Ambulating = 84-90% (90% for first 225', then 84% for last 75' of walk)   Tamala SerUhlenberg, Nathen Balaban Kistler PT 10/26/2016  [phone]

## 2016-10-26 NOTE — Progress Notes (Signed)
Patient ID: Alexander Camacho, male   DOB: 1945/03/26, 71 y.o.   MRN: [id number]    PROGRESS NOTE  Alexander Camacho  BJY:[id number] DOB: 1945/03/26 DOA: 10/24/2016  PCP: Patient, No Pcp Per   Brief Narrative:  Patient is 71 year old male with hypertension, hypothyroidism, BPH, [facility] with main concern of several days duration of progressively worsening dyspnea at rest as well as exertion. Patient was recently discharged from rehabilitation facility after prolonged hospitalization in [place] related to pneumonia and complicated by atrial fibrillation, renal failure requiring short-term dialysis. Daughter at bedside reports patient's baseline weight from recent discharge from rehabilitation was 235 pounds. Weight prior to that is not known.  In emergency department, patient was given Lasix 40 mg IV 1 dose and diuresis 1600 mL after initial dose.  Assessment & Plan: Acute diastolic CHF - Possibly triggered by atrial fibrillation and hypertension - Patient clinically improving on Lasix 40 mg IV twice a day - Continue same regimen for now - Weight trend since admission Filed Weights   10/25/16 0639 10/25/16 1102 10/26/16 0515  Weight: 103.8 kg (228 lb 13.4 oz) 103.8 kg (228 lb 13.4 oz) 99.4 kg (219 lb 2.2 oz)  - cardiology has been consulted for assistance - Monitor daily weights, strict intake and urine output  A. Fib  - Rate controlled at this time, continue metoprolol and Coumadin - Cardiology consulted as noted above  Physical deconditioning - physical therapy saw in consultation, recommend cardiopulmonary rehabilitation - Cardiology team to order if determine appropriate  Acute kidney injury imposed on likely CKD stage III - per daughter's report, patient has required short-term dialysis on recent admission - Patient's daughter report his kidney injury was caused by contrast given with CT scans - review of records indicate  creatinine on recent discharge was around 1.5 - Creatinine is currently stable, we'll continue to monitor closely - BMP in the morning  Obesity - Body mass index is 33.32 kg/m.  DVT prophylaxis: Coumadin Code Status: full code Family Communication: patient and his daughter updated at bedside Disposition Plan: patient will be discharged home once medically stable  Consultants:   Cardiology   Procedures:   None   Antimicrobials:   None   Subjective: Still short of breath especially with exertion. Reports feeling overall better.  Objective: Vitals:   10/25/16 2130 10/25/16 2143 10/26/16 0510 10/26/16 0515  BP: (!) 152/90  (!) 149/107 (!) 137/98  Pulse: (!) 102 96 91 87  Resp: 20  20   Temp: 98.1 F (36.7 C)  98.8 F (37.1 C)   TempSrc: Oral  Oral   SpO2: 94%  93%   Weight:    99.4 kg (219 lb 2.2 oz)  Height:        Intake/Output Summary (Last 24 hours) at 10/26/16 0828 Last data filed at 10/26/16 0517  Gross per 24 hour  Intake                0 ml  Output             5225 ml  Net            -5225 ml   Filed Weights   10/25/16 0639 10/25/16 1102 10/26/16 0515  Weight: 103.8 kg (228 lb 13.4 oz) 103.8 kg (228 lb 13.4 oz) 99.4 kg (219 lb 2.2 oz)    Examination:  General exam: Appears calm and comfortable  Respiratory system: mild tachypnea noted but overall stable, crackles at bases,  no wheezing  Cardiovascular system: S1 & S2 heard, RRR. No rubs, gallops or clicks. +2 bilateral LE pitting edema  Gastrointestinal system: Abdomen is nondistended, soft and nontender. No organomegaly or masses felt. Normal bowel sounds heard. Central nervous system: Alert and oriented. No focal neurological deficits. Extremities: Symmetric 5 x 5 power. Chronic bilateral venous stasis changes  Psychiatry: Judgement and insight appear normal. Mood & affect appropriate.   Data Reviewed: I have personally reviewed following labs and imaging studies  CBC:  Recent Labs Lab  10/25/16 0030  WBC 7.5  NEUTROABS 5.8  HGB 8.2*  HCT 25.4*  MCV 92.0  PLT 153   Basic Metabolic Panel:  Recent Labs Lab 10/25/16 0030 10/25/16 0841 10/26/16 0455  NA 142  --  144  K 4.6  --  3.5  CL 109  --  106  CO2 28  --  30  GLUCOSE 146*  --  125*  BUN 32*  --  31*  CREATININE 1.49*  --  1.49*  CALCIUM 8.0*  --  8.6*  MG  --  1.8  --    Liver Function Tests:  Recent Labs Lab 10/25/16 0030  AST 21  ALT 50  ALKPHOS 98  BILITOT 0.7  PROT 7.4  ALBUMIN 3.2*   Coagulation Profile:  Recent Labs Lab 10/25/16 0033 10/25/16 0456 10/26/16 0455  INR 2.30 2.46 2.26   Thyroid Function Tests:  Recent Labs  10/25/16 0841  TSH 4.788*   Radiology Studies: Dg Chest 2 View  Result Date: 10/25/2016 CLINICAL DATA:  71 year old male with shortness of breath. EXAM: CHEST  2 VIEW COMPARISON:  None. FINDINGS: There are small bilateral pleural effusions, right greater than left. There is associated partial compressive atelectasis of the lower lobes. Superimposed pneumonia is not excluded. Clinical correlation is recommended. There is no pneumothorax. There is cardiomegaly. There is silhouetting of the cardiac border by the pleuroparenchymal densities. No acute osseous pathology. IMPRESSION: Bilateral pleural effusions and bilateral lower lobe atelectatic changes versus infiltrate. Clinical correlation is recommended. Electronically Signed   By: Elgie Collard M.D.   On: 10/25/2016 00:30   Scheduled Meds: . acidophilus  1 capsule Oral Daily  . albuterol  5 mg Nebulization Once  . ferrous sulfate  325 mg Oral Q M,W,F,Sa-1800  . furosemide  40 mg Intravenous Q12H  . gabapentin  100 mg Oral TID  . levothyroxine  75 mcg Oral QAC breakfast  . metoprolol tartrate  100 mg Oral BID  . pantoprazole  40 mg Oral Daily  . potassium chloride  20 mEq Oral BID  . sodium chloride flush  3 mL Intravenous Q12H  . warfarin  7.5 mg Oral q1800  . Warfarin - Pharmacist Dosing Inpatient    Does not apply q1800  . zolpidem  5 mg Oral QHS   Continuous Infusions: . sodium chloride      LOS: 1 day   Time spent: 35 minutes   Debbora Presto, MD [facility] Pager [phone]  If 7PM-7AM, please contact night-coverage [email or web address] Password [email or web address] 10/26/2016, 8:28 AM

## 2016-10-26 NOTE — Consult Note (Addendum)
Cardiology Consultation:   Patient ID: Alexander AlmasGerald Willam Fratus; [id number]; November 23, 1945   Admit date: 10/24/2016 Date of Consult: 10/26/2016  Primary Care Provider: Patient, No Pcp Per Primary Cardiologist: Dr. Duke Salviaandolph (new)   Patient Profile:   Alexander Camacho is a 71 y.o. male with paroxysmal atrial fibrillation, hypertension, hypothyroidism, and CKD III who is being seen today for the evaluation of atrial fibrillation with rapid ventricular response and acute diastolic heart failure at the request of Dr. Danie BinderIskra Myers.  History of Present Illness:   Alexander Camacho was hospitalized 09/03/16 in [place] for [place].  That hospitalization was complicated by atrial fibrillation with rapid ventricular response and acute renal failure requiring short-term dialysis.  This was thought to be from contrast nephropathy after a CT scan.  He remained in atrial fibrillation throughout the hospitalization and was started on warfarin.  He was previously on metoprolol prior to admission and this was continued.  At discharge he had no lower extremity edema and his heart rate was reportedly well-controlled.  He was not aware of being in atrial fibrillation.  He was discharged to a rehab facility and just left there on 10/17.  By the time he left the rehab he had significant lower extremity edema.  He had no chest pain or shortness of breath.  He sleeps in a chair chronically due to musculoskeletal complaints.  Two days ago he developed increasing shortness of breath and worsening lower extremity edema.  He presented to the hospital where he was noted to be in atrial fibrillation with rapid ventricular response and significantly volume overloaded.  He was admitted to the hospitalist service and was started on IV Lasix.  He is net -7.2 L since admission.  He notes significant improvement in his lower extremity edema and his breathing.  An echocardiogram was performed this admission that reveals LVEF 55%  with mild aortic regurgitation, mild mitral regurgitation, and a moderately dilated left atrium.  PA SP was 63 mmHg.  Alexander Camacho lives outside [place].  Came [place] [facility] to live temporarily with 1 of his daughters after his prolonged hospitalization.  He endorses snoring and thinks he may have sleep apnea.  He has not been following any specific diet.  He drinks several cups of coffee daily.  He has been feeling generally fatigued but has no chest pain or shortness of breath.  Past Medical History:  Diagnosis Date  . Acute renal failure (HCC)   . Atrial fibrillation (HCC)   . Blood transfusion without reported diagnosis   . Hypertension   . Pneumonia     Past Surgical History:  Procedure Laterality Date  . APPENDECTOMY       Home Medications:  Prior to Admission medications   Medication Sig Start Date End Date Taking? Authorizing Provider  acidophilus (RISAQUAD) CAPS capsule Take 1 capsule by mouth daily.   Yes [provider]  amLODipine (NORVASC) 5 MG tablet Take 5 mg by mouth daily.   Yes [provider]  doxazosin (CARDURA) 4 MG tablet Take 4 mg by mouth at bedtime.   Yes [provider]  ferrous sulfate 325 (65 FE) MG tablet Take 325 mg by mouth every Monday, Wednesday, Friday, and Saturday at 6 PM.   Yes [provider]  gabapentin (NEURONTIN) 100 MG capsule Take 100 mg by mouth 3 (three) times daily.   Yes [provider]  levothyroxine (SYNTHROID, LEVOTHROID) 75 MCG tablet Take 75 mcg by mouth daily before breakfast.   Yes  [provider]  metoprolol tartrate (LOPRESSOR) 100 MG tablet Take 100 mg by mouth 2 (two) times daily.   Yes [provider]  pantoprazole (PROTONIX) 40 MG tablet Take 40 mg by mouth daily.   Yes [provider]  polyethylene glycol (MIRALAX / GLYCOLAX) packet Take 17 g by mouth daily as needed for moderate constipation.   Yes [provider]  warfarin (COUMADIN)  7.5 MG tablet Take 7.5 mg by mouth daily at 6 PM.   Yes [provider]  zolpidem (AMBIEN) 10 MG tablet Take 10 mg by mouth at bedtime.   Yes [provider]    Inpatient Medications: Scheduled Meds: . acidophilus  1 capsule Oral Daily  . albuterol  5 mg Nebulization Once  . ferrous sulfate  325 mg Oral Q M,W,F,Sa-1800  . furosemide  40 mg Intravenous BID  . gabapentin  100 mg Oral TID  . levothyroxine  75 mcg Oral QAC breakfast  . metoprolol tartrate  100 mg Oral BID  . pantoprazole  40 mg Oral Daily  . potassium chloride  20 mEq Oral BID  . sodium chloride flush  3 mL Intravenous Q12H  . warfarin  7.5 mg Oral q1800  . Warfarin - Pharmacist Dosing Inpatient   Does not apply q1800  . zolpidem  5 mg Oral QHS   Continuous Infusions: . sodium chloride     PRN Meds: sodium chloride, acetaminophen, ondansetron (ZOFRAN) IV, sodium chloride flush  Allergies:    Allergies  Allergen Reactions  . Lorazepam Other (See Comments)    Altered mental status, confusion  . Sulfa Antibiotics Anaphylaxis    Social History:   Social History   Social History  . Marital status: Single    Spouse name: N/A  . Number of children: N/A  . Years of education: N/A   Occupational History  . Not on file.   Social History Main Topics  . Smoking status: Never Smoker  . Smokeless tobacco: Never Used  . Alcohol use No  . Drug use: No  . Sexual activity: Not on file   Other Topics Concern  . Not on file   Social History Narrative  . No narrative on file    Family History:   History reviewed. No pertinent family history.  No family members with atrial fibrillation, MI or stroke.  ROS:  Please see the history of present illness.  ROS  A 12 point review of systems was obtained with pertinent positives noted in the HPI.  Physical Exam/Data:   Vitals:   10/26/16 0510 10/26/16 0515 10/26/16 0856 10/26/16 1201  BP: (!) 149/107 (!) 137/98 139/80   Pulse: 91 87 (!) 111  (!) 115  Resp: 20  20   Temp: 98.8 F (37.1 C)  98.1 F (36.7 C)   TempSrc: Oral  Oral   SpO2: 93%  94%   Weight:  99.4 kg (219 lb 2.2 oz)    Height:        Intake/Output Summary (Last 24 hours) at 10/26/16 1247 Last data filed at 10/26/16 1000  Gross per 24 hour  Intake                0 ml  Output             3900 ml  Net            -3900 ml   Filed Weights   10/25/16 0639 10/25/16 1102 10/26/16 0515  Weight: 103.8 kg (228  lb 13.4 oz) 103.8 kg (228 lb 13.4 oz) 99.4 kg (219 lb 2.2 oz)   VS:  BP 139/80 (BP Location: Right Arm)   Pulse (!) 115 Comment: walking  Temp 98.1 F (36.7 C) (Oral)   Resp 20   Ht 5\' 8"  (1.727 m)   Wt 99.4 kg (219 lb 2.2 oz)   SpO2 94%   BMI 33.32 kg/m  , BMI Body mass index is 33.32 kg/m. GENERAL:  Well appearing HEENT: Pupils equal round and reactive, fundi not visualized, oral mucosa unremarkable NECK:  +JVD. waveform within normal limits, carotid upstroke brisk and symmetric, no bruits, no thyromegaly LUNGS:  Clear to auscultation bilaterally HEART:  Tachycardic.  Irregularly irregular.   PMI not displaced or sustained,S1 and S2 within normal limits, no S3, no S4, no clicks, no rubs, no murmurs ABD:  Flat, positive bowel sounds normal in frequency in pitch, no bruits, no rebound, no guarding, no midline pulsatile mass, no hepatomegaly, no splenomegaly EXT:  2 plus pulses throughout, 2+ pitting edema to the lower tibia bilaterally, no cyanosis no clubbing SKIN: Stasis dermatitis to the mid tibia bilaterally NEURO:  Cranial nerves II through XII grossly intact, motor grossly intact throughout PSYCH:  Cognitively intact, oriented to person place and time  EKG:  The EKG was personally reviewed and demonstrates:  Atrial fibrillation.  Rate 98 bpm.  Telemetry:  Telemetry was personally reviewed and demonstrates:  Atrial fibrillation.  Rate 90s-130s.   Relevant CV Studies:  Echo 10/25/16: Study Conclusions  - Left ventricle: The cavity size was  mildly dilated. There was   moderate concentric hypertrophy. Systolic function was normal.   The estimated ejection fraction was 55%. Wall motion was normal;   there were no regional wall motion abnormalities. The study was   not technically sufficient to allow evaluation of LV diastolic   dysfunction due to atrial fibrillation. - Aortic valve: Moderately calcified annulus. Trileaflet; mildly   thickened, mildly calcified leaflets. There was mild   regurgitation. - Mitral valve: Calcified annulus. There was mild regurgitation. - Left atrium: The atrium was moderately dilated. - Pulmonary arteries: PA peak pressure: 63 mm Hg (S). - Pericardium, extracardiac: There was a large left pleural   effusion.  Impressions:  - The right ventricular systolic pressure was increased consistent   with moderate pulmonary hypertension.  Laboratory Data:  Chemistry Recent Labs Lab 10/25/16 0030 10/26/16 0455  NA 142 144  K 4.6 3.5  CL 109 106  CO2 28 30  GLUCOSE 146* 125*  BUN 32* 31*  CREATININE 1.49* 1.49*  CALCIUM 8.0* 8.6*  GFRNONAA 45* 45*  GFRAA 53* 53*  ANIONGAP 5 8     Recent Labs Lab 10/25/16 0030  PROT 7.4  ALBUMIN 3.2*  AST 21  ALT 50  ALKPHOS 98  BILITOT 0.7   Hematology Recent Labs Lab 10/25/16 0030  WBC 7.5  RBC 2.76*  HGB 8.2*  HCT 25.4*  MCV 92.0  MCH 29.7  MCHC 32.3  RDW 16.6*  PLT 153   Cardiac EnzymesNo results for input(s): TROPONINI in the last 168 hours.  Recent Labs Lab 10/25/16 0040  TROPIPOC 0.01    BNP Recent Labs Lab 10/25/16 0030  BNP 799.0*    DDimer No results for input(s): DDIMER in the last 168 hours.  Radiology/Studies:  Dg Chest 2 View  Result Date: 10/26/2016 CLINICAL DATA:  Follow-up CHF. EXAM: CHEST  2 VIEW COMPARISON:  10/25/2016 FINDINGS: Persistent basilar chest densities, right side greater the left.  Right basilar densities are suggestive for combination of consolidation and pleural fluid. Subsegmental  atelectasis and/or pleural fissure fluid in the mid right chest. Fullness along the right side of the mediastinum and right cardiac shadow is unchanged. Interstitial markings are slightly enlarged. Negative for pneumothorax. The trachea is midline. IMPRESSION: Persistent bibasilar chest densities, right side greater than left. Chest densities likely represent a combination of consolidation and pleural fluid. Right pleural fluid may be loculated based on the configuration. Mild fullness along the right side of the cardiac shadow and right mediastinum. Lymphadenopathy in this area cannot be excluded. Consider further characterization with a Chest CT with IV contrast. Interstitial markings are prominent and may represent mild interstitial edema. Electronically Signed   By: Richarda Overlie M.D.   On: 10/26/2016 08:34   Dg Chest 2 View  Result Date: 10/25/2016 CLINICAL DATA:  71 year old male with shortness of breath. EXAM: CHEST  2 VIEW COMPARISON:  None. FINDINGS: There are small bilateral pleural effusions, right greater than left. There is associated partial compressive atelectasis of the lower lobes. Superimposed pneumonia is not excluded. Clinical correlation is recommended. There is no pneumothorax. There is cardiomegaly. There is silhouetting of the cardiac border by the pleuroparenchymal densities. No acute osseous pathology. IMPRESSION: Bilateral pleural effusions and bilateral lower lobe atelectatic changes versus infiltrate. Clinical correlation is recommended. Electronically Signed   By: Elgie Collard M.D.   On: 10/25/2016 00:30    Assessment and Plan:   # Atrial fibrillation with RVR: Mr. Knierim remains in atrial fibrillation.  His heart rates are poorly-controlled.  We will add diltiazem 60 mg every 8 hours.  Continue metoprolol 100 mg twice daily.  He will continue on warfarin.  He has Medicare but not part D.  We will consider making a change to his insurance coverage and could consider Eliquis  or Xarelto at that time.  It is not clear that atrial fibrillation is causing him symptoms.  He does have shortness of breath and fatigue, though that seems to be due to volume overload and his prolonged hospitalization.  Once he is euvolemic we will consider trying a cardioversion to see if it helps his energy levels.  For now, continue rate control.  We also discussed the importance of limiting his caffeine intake.  He will need an outpatient sleep study, as I suspect he also has OSA.  I also recommended that he work on weight loss.  His ejection fraction was normal on echo.  Left atrium was moderately enlarged.  Of note, he is a Music therapist. However, This patients CHA2DS2-VASc Score and unadjusted Ischemic Stroke Rate (% per year) is equal to 3.2 % stroke rate/year from a score of 3  Above score calculated as 1 point each if present [CHF, HTN, DM, Vascular=MI/PAD/Aortic Plaque, Age if 65-74, or Male] Above score calculated as 2 points each if present [Age > 75, or Stroke/TIA/TE] Continue anticoagulation.  TSH was mildly elevated, but free T4 is normal.    # Acute diastolic heart failure:  Continue diuresis with IV lasix.  Continue metoprolol and adding dilitiazem as above.      For questions or updates, please contact [facility] Please consult [email or web address] for contact info under Cardiology/STEMI.   Signed, Chilton Si, MD  10/26/2016 12:47 PM

## 2016-10-26 NOTE — Evaluation (Signed)
Physical Therapy Evaluation Patient Details Name: Alexander Camacho MRN: [id number] DOB: 11/11/1945 Today's Date: 10/26/2016   History of Present Illness  71 year old male with hypertension with recent prolonged hospitalization in [place] with pneumonia with respiratory failure requiring intubation, hospital course prolonged due to rapid A. Fib, acute renal failure secondary to contrast nephropathy (as per daughter) requiring short-term dialysis. He was then discharged to rehabilitation for almost 3 weeks and came to stay with his daughter in [place] 2 days prior to admission. Pt admitted with SOB and BLE swelling. Dx of acute CHF.  Clinical Impression  Pt admitted with above diagnosis. Pt currently with functional limitations due to the deficits listed below (see PT Problem List). Pt ambulated 300', SaO2 90% on room air until last 75' of walk when it dropped to 84%, HR 115 walking, 2/4 dyspnea at end of walk. Instructed pt in BLE strengthening exercises as he reports weakness due to recent prolonged hospitalization. 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 He would like to do aggressive rehab, outpt cardiopulmonary rehab recommended.  Pt will benefit from skilled PT to increase their independence and safety with mobility to allow discharge to the venue listed below.       Follow Up Recommendations Other (comment) (outpatient cardiopulmonary rehab)    Equipment Recommendations  None recommended by PT    Recommendations for Other Services       Precautions / Restrictions Precautions Precautions: Other (comment) Precaution Comments: monitor O2 Restrictions Weight Bearing Restrictions: No      Mobility  Bed Mobility               General bed mobility comments: up in recliner  Transfers Overall transfer level: Needs assistance Equipment used: Rolling walker (2 wheeled) Transfers: Sit to/from Stand Sit to  Stand: Modified independent (Device/Increase time)         General transfer comment: pushed up with B armrests  Ambulation/Gait Ambulation/Gait assistance: Supervision Ambulation Distance (Feet): 300 Feet Assistive device: Rolling walker (2 wheeled) Gait Pattern/deviations: Step-through pattern   Gait velocity interpretation: Below normal speed for age/gender General Gait Details: steady with RW, no LOB SaO2 90% on room air for first 225', then dropped to 84% on room air for last 75' of walk, pt had 2/4 dyspnea at end of walk, HR 115 walking  Stairs            Wheelchair Mobility    Modified Rankin (Stroke Patients Only)       Balance Overall balance assessment: Modified Independent                                           Pertinent Vitals/Pain Pain Assessment: No/denies pain    Home Living Family/patient expects to be discharged to:: Private residence Living Arrangements: Children Available Help at Discharge: Family;Available 24 hours/day Type of Home: House Home Access: Stairs to enter Entrance Stairs-Rails: Right;Left;Can reach both Entrance Stairs-Number of Steps: 4 Home Layout: One level Home Equipment: Walker - 4 wheels;Shower seat;Adaptive equipment      Prior Function Level of Independence: Independent with assistive device(s)         Comments: walked with rollator, dressing lower body labored but independent with reacher     Hand Dominance        Extremity/Trunk Assessment   Upper Extremity Assessment Upper Extremity  Assessment: Defer to OT evaluation    Lower Extremity Assessment Lower Extremity Assessment: Overall WFL for tasks assessed (4/5 BLEs grossly; pt reports BLEs are weaker than at baseline, he has trouble lifting LEs for getting up stairs and for driving, pt able to actively flex B hips against gravity in sitting)    Cervical / Trunk Assessment Cervical / Trunk Assessment: Normal  Communication    Communication: HOH (hears better out of L ear)  Cognition Arousal/Alertness: Awake/alert Behavior During Therapy: WFL for tasks assessed/performed Overall Cognitive Status: Within Functional Limits for tasks assessed                                        General Comments      Exercises General Exercises - Lower Extremity Ankle Circles/Pumps: AROM;Both;10 reps;Seated Long Arc Quad: AROM;Both;10 reps;Seated Hip Flexion/Marching: AROM;Both;5 reps;Seated   Assessment/Plan    PT Assessment Patient needs continued PT services  PT Problem List Cardiopulmonary status limiting activity;Decreased activity tolerance       PT Treatment Interventions Gait training;Therapeutic exercise;Patient/family education    PT Goals (Current goals can be found in the Care Plan section)  Acute Rehab PT Goals Patient Stated Goal: return to living in [facility] and working as a Music therapist PT Goal Formulation: With patient/family Time For Goal Achievement: 11/09/16 Potential to Achieve Goals: Good    Frequency Min 3X/week   Barriers to discharge        Co-evaluation               AM-PAC PT "6 Clicks" Daily Activity  Outcome Measure Difficulty turning over in bed (including adjusting bedclothes, sheets and blankets)?: None Difficulty moving from lying on back to sitting on the side of the bed? : None Difficulty sitting down on and standing up from a chair with arms (e.g., wheelchair, bedside commode, etc,.)?: A Little Help needed moving to and from a bed to chair (including a wheelchair)?: None Help needed walking in hospital room?: None Help needed climbing 3-5 steps with a railing? : A Little 6 Click Score: 22    End of Session Equipment Utilized During Treatment: Gait belt Activity Tolerance: Patient tolerated treatment well Patient left: in chair;with call bell/phone within reach;with family/visitor present Nurse Communication: Mobility status PT Visit Diagnosis: Difficulty  in walking, not elsewhere classified (R26.2)    Time: 1610-9604 PT Time Calculation (min) (ACUTE ONLY): 39 min   Charges:   PT Evaluation $PT Eval Low Complexity: 1 Low PT Treatments $Gait Training: 8-22 mins $Therapeutic Exercise: 8-22 mins   PT G Codes:          Tamala Ser 10/26/2016, 12:09 PM [phone]

## 2016-10-27 LAB — C DIFFICILE QUICK SCREEN W PCR REFLEX
C DIFFICILE (CDIFF) TOXIN: POSITIVE — AB
C DIFFICLE (CDIFF) ANTIGEN: POSITIVE — AB
C Diff interpretation: DETECTED

## 2016-10-27 LAB — RENAL FUNCTION PANEL
ANION GAP: 8 (ref 5–15)
Albumin: 3.2 g/dL — ABNORMAL LOW (ref 3.5–5.0)
BUN: 39 mg/dL — ABNORMAL HIGH (ref 6–20)
CO2: 30 mmol/L (ref 22–32)
CREATININE: 1.47 mg/dL — AB (ref 0.61–1.24)
Calcium: 8.6 mg/dL — ABNORMAL LOW (ref 8.9–10.3)
Chloride: 106 mmol/L (ref 101–111)
GFR, EST AFRICAN AMERICAN: 54 mL/min — AB (ref >60)
GFR, EST NON AFRICAN AMERICAN: 46 mL/min — AB (ref >60)
Glucose, Bld: 131 mg/dL — ABNORMAL HIGH (ref 65–99)
POTASSIUM: 3.6 mmol/L (ref 3.5–5.1)
Phosphorus: 4.2 mg/dL (ref 2.5–4.6)
Sodium: 144 mmol/L (ref 135–145)

## 2016-10-27 LAB — PROTIME-INR
INR: 2.35
Prothrombin Time: 25.5 seconds — ABNORMAL HIGH (ref 11.4–15.2)

## 2016-10-27 LAB — CBC
HEMATOCRIT: 25.9 % — AB (ref 39.0–52.0)
HEMOGLOBIN: 8.5 g/dL — AB (ref 13.0–17.0)
MCH: 29.8 pg (ref 26.0–34.0)
MCHC: 32.8 g/dL (ref 30.0–36.0)
MCV: 90.9 fL (ref 78.0–100.0)
PLATELETS: 156 10*3/uL (ref 150–400)
RBC: 2.85 MIL/uL — AB (ref 4.22–5.81)
RDW: 16.5 % — ABNORMAL HIGH (ref 11.5–15.5)
WBC: 7.6 10*3/uL (ref 4.0–10.5)

## 2016-10-27 LAB — T3: T3 TOTAL: 72 ng/dL (ref 71–180)

## 2016-10-27 MED ORDER — METRONIDAZOLE IN NACL 5-0.79 MG/ML-% IV SOLN
500.0000 mg | Freq: Three times a day (TID) | INTRAVENOUS | Status: DC
  Filled 2016-10-27 (×2): qty 100

## 2016-10-27 MED ORDER — VANCOMYCIN 50 MG/ML ORAL SOLUTION
125.0000 mg | Freq: Four times a day (QID) | ORAL | Status: DC
  Administered 2016-10-27 – 2016-10-28 (×3): 125 mg via ORAL
  Filled 2016-10-27 (×4): qty 2.5

## 2016-10-27 MED ORDER — DILTIAZEM HCL ER COATED BEADS 180 MG PO CP24: 180.0000 mg | ORAL_CAPSULE | Freq: Every day | ORAL | Status: DC

## 2016-10-27 MED ORDER — FUROSEMIDE 40 MG PO TABS
40.0000 mg | ORAL_TABLET | Freq: Two times a day (BID) | ORAL | Status: DC
  Administered 2016-10-27 – 2016-10-28 (×2): 40 mg via ORAL
  Filled 2016-10-27 (×2): qty 1

## 2016-10-27 MED ORDER — ZOLPIDEM TARTRATE 10 MG PO TABS: 10.0000 mg | ORAL_TABLET | Freq: Every day | ORAL | Status: DC

## 2016-10-27 MED ORDER — ZOLPIDEM TARTRATE 5 MG PO TABS
5.0000 mg | ORAL_TABLET | Freq: Once | ORAL | Status: AC
  Administered 2016-10-27: 5 mg via ORAL
  Filled 2016-10-27: qty 1

## 2016-10-27 MED ORDER — LOPERAMIDE HCL 2 MG PO CAPS
2.0000 mg | ORAL_CAPSULE | ORAL | Status: AC | PRN
  Administered 2016-10-27: 2 mg via ORAL
  Filled 2016-10-27: qty 1

## 2016-10-27 NOTE — Progress Notes (Signed)
CRITICAL VALUE ALERT  Critical Value:  C-diff (+)  Date & Time Notied:  10/27/16  19:50  Provider Notified: On call notified via text page  Orders Received/Actions taken:

## 2016-10-27 NOTE — Progress Notes (Signed)
MD updated with PT INR. No new orders

## 2016-10-27 NOTE — Progress Notes (Signed)
Progress Note  Patient Name: Alexander Camacho Flight Date of Encounter: 10/27/2016  Primary Cardiologist: Dr. Duke Salvia (New)  Subjective   The patient continues to improve. His breathing is better. His edema is almost back to normal. He walked the entire length of the hall and back twice yesterday and only became short of breath toward the end. He is having loose BM's this morning.   Inpatient Medications    Scheduled Meds: . acidophilus  1 capsule Oral Daily  . albuterol  5 mg Nebulization Once  . diltiazem  60 mg Oral Q8H  . ferrous sulfate  325 mg Oral Q M,W,F,Sa-1800  . furosemide  40 mg Intravenous BID  . gabapentin  100 mg Oral TID  . levothyroxine  75 mcg Oral QAC breakfast  . metoprolol tartrate  100 mg Oral BID  . pantoprazole  40 mg Oral Daily  . potassium chloride  20 mEq Oral BID  . sodium chloride flush  3 mL Intravenous Q12H  . warfarin  7.5 mg Oral q1800  . Warfarin - Pharmacist Dosing Inpatient   Does not apply q1800  . zolpidem  5 mg Oral QHS   Continuous Infusions: . sodium chloride     PRN Meds: sodium chloride, acetaminophen, ondansetron (ZOFRAN) IV, sodium chloride flush   Vital Signs    Vitals:   10/26/16 1201 10/26/16 1503 10/26/16 2158 10/27/16 0556  BP:  128/78 140/72 (!) 145/84  Pulse: (!) 115 82 86 83  Resp:  20 20 18   Temp:  98.3 F (36.8 C) 98.6 F (37 C) 97.9 F (36.6 C)  TempSrc:  Oral Oral Oral  SpO2:  97% 97% 96%  Weight:    216 lb 12.8 oz (98.3 kg)  Height:        Intake/Output Summary (Last 24 hours) at 10/27/16 0930 Last data filed at 10/27/16 0429  Gross per 24 hour  Intake              400 ml  Output             3800 ml  Net            -3400 ml   Filed Weights   10/25/16 1102 10/26/16 0515 10/27/16 0556  Weight: 228 lb 13.4 oz (103.8 kg) 219 lb 2.2 oz (99.4 kg) 216 lb 12.8 oz (98.3 kg)    Telemetry    Atrial fibrillation with rates in the 60's-70's - Personally Reviewed  ECG    No new tracings - Personally  Reviewed  Physical Exam   GEN: No acute distress.   Neck: No JVD Cardiac: irregular irregular rhythm, normal rate,  no murmurs, rubs, or gallops.  Respiratory: Clear to auscultation bilaterally. GI: Soft, nontender, non-distended  MS: trace-1+ lower leg edema; No deformity. Skin: stasis dermatitis to mid calf bilat Neuro:  Nonfocal  Psych: Normal affect   Labs    Chemistry Recent Labs Lab 10/25/16 0030 10/26/16 0455 10/27/16 0422  NA 142 144 144  K 4.6 3.5 3.6  CL 109 106 106  CO2 28 30 30   GLUCOSE 146* 125* 131*  BUN 32* 31* 39*  CREATININE 1.49* 1.49* 1.47*  CALCIUM 8.0* 8.6* 8.6*  PROT 7.4  --   --   ALBUMIN 3.2*  --  3.2*  AST 21  --   --   ALT 50  --   --   ALKPHOS 98  --   --   BILITOT 0.7  --   --  GFRNONAA 45* 45* 46*  GFRAA 53* 53* 54*  ANIONGAP 5 8 8      Hematology Recent Labs Lab 10/25/16 0030 10/27/16 0422  WBC 7.5 7.6  RBC 2.76* 2.85*  HGB 8.2* 8.5*  HCT 25.4* 25.9*  MCV 92.0 90.9  MCH 29.7 29.8  MCHC 32.3 32.8  RDW 16.6* 16.5*  PLT 153 156    Cardiac EnzymesNo results for input(s): TROPONINI in the last 168 hours.  Recent Labs Lab 10/25/16 0040  TROPIPOC 0.01     BNP Recent Labs Lab 10/25/16 0030  BNP 799.0*     DDimer No results for input(s): DDIMER in the last 168 hours.   Radiology    Dg Chest 2 View  Result Date: 10/26/2016 CLINICAL DATA:  Follow-up CHF. EXAM: CHEST  2 VIEW COMPARISON:  10/25/2016 FINDINGS: Persistent basilar chest densities, right side greater the left. Right basilar densities are suggestive for combination of consolidation and pleural fluid. Subsegmental atelectasis and/or pleural fissure fluid in the mid right chest. Fullness along the right side of the mediastinum and right cardiac shadow is unchanged. Interstitial markings are slightly enlarged. Negative for pneumothorax. The trachea is midline. IMPRESSION: Persistent bibasilar chest densities, right side greater than left. Chest densities likely  represent a combination of consolidation and pleural fluid. Right pleural fluid may be loculated based on the configuration. Mild fullness along the right side of the cardiac shadow and right mediastinum. Lymphadenopathy in this area cannot be excluded. Consider further characterization with a Chest CT with IV contrast. Interstitial markings are prominent and may represent mild interstitial edema. Electronically Signed   By: Richarda OverlieAdam  Henn M.D.   On: 10/26/2016 08:34    Cardiac Studies   Echo 10/25/16: Study Conclusions - Left ventricle: The cavity size was mildly dilated. There was moderate concentric hypertrophy. Systolic function was normal. The estimated ejection fraction was 55%. Wall motion was normal; there were no regional wall motion abnormalities. The study was not technically sufficient to allow evaluation of LV diastolic dysfunction due to atrial fibrillation. - Aortic valve: Moderately calcified annulus. Trileaflet; mildly thickened, mildly calcified leaflets. There was mild regurgitation. - Mitral valve: Calcified annulus. There was mild regurgitation. - Left atrium: The atrium was moderately dilated. - Pulmonary arteries: PA peak pressure: 63 mm Hg (S). - Pericardium, extracardiac: There was a large left pleural effusion.  Impressions: - The right ventricular systolic pressure was increased consistent with moderate pulmonary hypertension.  Patient Profile     71 y.o. male with paroxysmal atrial fibrillation, hypertension, hypothyroidism, and CKD III admitted on 10/22. with atrial fibrillation with rapid ventricular response and acute diastolic heart failure with symptoms of worsening shortness of breath and lower extremity edema. BNP was 799. Diuresed by hospital service but continued difficult to control heart rate. TSH was mildly elevated, but free T4 is normal.     Assessment & Plan    Atrial fibrillation with RVR: Admitted with atrial fibrillation  with RVR and has had poorly controlled rate on metoprolol.  Diltiazem 60 mg every 8 hours was added yesterday and metoprolol 100 mg twice daily continued.  He continues in afib but heart rate is much better, in the 60's-70's. BP is stable and no lightheadedness. He continues on warfarin for stroke risk reduction. INR therapeutic at 2.35. Patient has Medicare but not part be.  He is considering a change to his insurance coverage that would possibly cover a DOAC. Continue current therapy.   VASc Score and unadjusted Ischemic Stroke Rate (% per year)  is equal to 3.2 % stroke rate/year from a score of 3  Above score calculated as 1 point each if present [CHF, HTN, DM, Vascular=MI/PAD/Aortic Plaque, Age if 65-74, or Male] Above score calculated as 2 points each if present [Age > 75, or Stroke/TIA/TE]  Acute diastolic heart failure: Continues on diuretics, lasix 40 mg IV bid. Wt is down 235-->216 (down 3 lbs since yesterday). Pt states usual home wt is around 220 lbs, Wt at office visit in July was 228 lbs. Pt had 3.8L UOP yesterday and is negative 10L since admission. Renal function is stable. Breathing and edema have greatly improved with diuresis and heart rate control. Almost to baseline.    For questions or updates, please contact [facility] Please consult [email or web address] for contact info under Cardiology/STEMI.      Signed, Berton Bon, NP  10/27/2016, 9:30 AM

## 2016-10-27 NOTE — Progress Notes (Signed)
Patient ID: Alexander Camacho, male   DOB: Jul 31, 1945, 71 y.o.   MRN: [id number]    PROGRESS NOTE  Alexander Camacho  [id number] DOB: Jul 31, 1945 DOA: 10/24/2016  PCP: Patient, No Pcp Per   Brief Narrative:  Patient is 71 year old male with hypertension, hypothyroidism, BPH, [facility] with main concern of several days duration of progressively worsening dyspnea at rest as well as exertion. Patient was recently discharged from rehabilitation facility [place] and complicated by atrial fibrillation, renal failure requiring short-term dialysis. Daughter at bedside reports patient's baseline weight from recent discharge from rehabilitation was 235 pounds. Weight prior to that is not known.  In emergency department, patient was given Lasix 40 mg IV 1 dose and diuresis 1600 mL after initial dose.  Assessment & Plan: Acute diastolic CHF - Possibly triggered by atrial fibrillation and hypertension - Patient clinically improving on Lasix 40 mg IV twice a day - Cr overall stable but BUN rising, plan to change to oral Lasix per cardiology  - [facility]   10/25/16 1102 10/26/16 0515 10/27/16 0556  Weight: 103.8 kg (228 lb 13.4 oz) 99.4 kg (219 lb 2.2 oz) 98.3 kg (216 lb 12.8 oz)  - continue to monitor daily weights, strict I/O  A. Fib  - Rate controlled at this time, continue metoprolol and Coumadin - cardiology added Cardizem   Physical deconditioning - physical therapy saw in consultation, recommend cardiopulmonary rehabilitation - Cardiology team to order if determine appropriate  Acute kidney injury imposed on likely CKD stage III - per daughter's report, patient has required short-term dialysis on recent admission - Patient's daughter report his kidney injury was caused by contrast given with CT scans - review of records indicate creatinine on recent  discharge was around 1.5 - Cr overall stable, BMP in AM  Obesity - Body mass index is 33.32 kg/m.  DVT prophylaxis: Coumadin Code Status: full code Family Communication: patient and his daughter updated at bedside Disposition Plan: patient will be discharged home once medically stable  Consultants:   Cardiology   Procedures:   None   Antimicrobials:   None   Subjective: Pt reports feeling better.   Objective: Vitals:   10/26/16 1503 10/26/16 2158 10/27/16 0556 10/27/16 1124  BP: 128/78 140/72 (!) 145/84 130/87  Pulse: 82 86 83   Resp: 20 20 18    Temp: 98.3 F (36.8 C) 98.6 F (37 C) 97.9 F (36.6 C)   TempSrc: Oral Oral Oral   SpO2: 97% 97% 96%   Weight:   98.3 kg (216 lb 12.8 oz)   Height:        Intake/Output Summary (Last 24 hours) at 10/27/16 1419 Last data filed at 10/27/16 1059  Gross per 24 hour  Intake              280 ml  Output             3575 ml  Net            -3295 ml   Filed Weights   10/25/16 1102 10/26/16 0515 10/27/16 0556  Weight: 103.8 kg (228 lb 13.4 oz) 99.4 kg (219 lb 2.2 oz) 98.3 kg (216 lb 12.8 oz)    Physical Exam  Constitutional: Appears NAD, Calm  CVS: Irregular rate and rhythm, no rubs or gallops   Pulmonary: Effort and breath sounds normal, no stridor, rhonchi, wheezes, rales.  Abdominal: Soft. BS +,  no distension, tenderness, rebound or guarding.  Musculoskeletal: Normal range of motion. +1-2 bilateral LE edema better.   Data Reviewed: I have personally reviewed following labs and imaging studies  CBC:  Recent Labs Lab 10/25/16 0030 10/27/16 0422  WBC 7.5 7.6  NEUTROABS 5.8  --   HGB 8.2* 8.5*  HCT 25.4* 25.9*  MCV 92.0 90.9  PLT 153 156   Basic Metabolic Panel:  Recent Labs Lab 10/25/16 0030 10/25/16 0841 10/26/16 0455 10/27/16 0422  NA 142  --  144 144  K 4.6  --  3.5 3.6  CL 109  --  106 106  CO2 28  --  30 30  GLUCOSE 146*  --  125* 131*  BUN 32*  --  31* 39*  CREATININE 1.49*  --  1.49*  1.47*  CALCIUM 8.0*  --  8.6* 8.6*  MG  --  1.8  --   --   PHOS  --   --   --  4.2   Liver Function Tests:  Recent Labs Lab 10/25/16 0030 10/27/16 0422  AST 21  --   ALT 50  --   ALKPHOS 98  --   BILITOT 0.7  --   PROT 7.4  --   ALBUMIN 3.2* 3.2*   Coagulation Profile:  Recent Labs Lab 10/25/16 0033 10/25/16 0456 10/26/16 0455 10/27/16 0422  INR 2.30 2.46 2.26 2.35   Thyroid Function Tests:  Recent Labs  10/25/16 0841 10/25/16 2329  TSH 4.788*  --   FREET4  --  1.11   Radiology Studies: Dg Chest 2 View  Result Date: 10/26/2016 CLINICAL DATA:  Follow-up CHF. EXAM: CHEST  2 VIEW COMPARISON:  10/25/2016 FINDINGS: Persistent basilar chest densities, right side greater the left. Right basilar densities are suggestive for combination of consolidation and pleural fluid. Subsegmental atelectasis and/or pleural fissure fluid in the mid right chest. Fullness along the right side of the mediastinum and right cardiac shadow is unchanged. Interstitial markings are slightly enlarged. Negative for pneumothorax. The trachea is midline. IMPRESSION: Persistent bibasilar chest densities, right side greater than left. Chest densities likely represent a combination of consolidation and pleural fluid. Right pleural fluid may be loculated based on the configuration. Mild fullness along the right side of the cardiac shadow and right mediastinum. Lymphadenopathy in this area cannot be excluded. Consider further characterization with a Chest CT with IV contrast. Interstitial markings are prominent and may represent mild interstitial edema. Electronically Signed   By: Richarda Overlie M.D.   On: 10/26/2016 08:34   Scheduled Meds: . acidophilus  1 capsule Oral Daily  . albuterol  5 mg Nebulization Once  . [START ON 10/28/2016] diltiazem  180 mg Oral QHS  . diltiazem  60 mg Oral Q8H  . ferrous sulfate  325 mg Oral Q M,W,F,Sa-1800  . furosemide  40 mg Oral BID  . gabapentin  100 mg Oral TID  .  levothyroxine  75 mcg Oral QAC breakfast  . metoprolol tartrate  100 mg Oral BID  . pantoprazole  40 mg Oral Daily  . potassium chloride  20 mEq Oral BID  . sodium chloride flush  3 mL Intravenous Q12H  . warfarin  7.5 mg Oral q1800  . Warfarin - Pharmacist Dosing Inpatient   Does not apply q1800  . zolpidem  5 mg Oral QHS   Continuous Infusions: . sodium chloride      LOS: 2 days   Time spent: 25 minutes   Debbora Presto, MD  [facility] Pager [phone]  If 7PM-7AM, please contact night-coverage [email or web address] Password [email or web address] 10/27/2016, 2:19 PM

## 2016-10-27 NOTE — Progress Notes (Signed)
Occupational Therapy Treatment Patient Details Name: Alexander Camacho MRN: [id number] DOB: December 22, 1945 Today's Date: 10/27/2016    History of present illness 71 year old male with hypertension with recent prolonged hospitalization in [place] with pneumonia with respiratory failure requiring intubation, hospital course prolonged due to rapid A. Fib, acute renal failure secondary to contrast nephropathy (as per daughter) requiring short-term dialysis. He was then discharged to rehabilitation for almost 3 weeks and came to stay with his daughter in [place] 2 days prior to admission. Pt admitted with SOB and BLE swelling. Dx of acute CHF.   OT comments  Daughter present for OT   Follow Up Recommendations  Other (comment) (caridiac rehab)    Equipment Recommendations  None recommended by OT    Recommendations for Other Services      Precautions / Restrictions Precautions Precautions: Other (comment) Precaution Comments: monitor O2 Restrictions Weight Bearing Restrictions: No       Mobility Bed Mobility               General bed mobility comments: up in recliner  Transfers Overall transfer level: Needs assistance Equipment used: Rolling walker (2 wheeled) Transfers: Sit to/from Stand Sit to Stand: Supervision         General transfer comment: Vc for arm placement    Balance Overall balance assessment: Modified Independent                                         ADL either performed or assessed with clinical judgement   ADL Overall ADL's : Needs assistance/impaired     Grooming: Standing;Supervision/safety                   Toilet Transfer: Supervision/safety;RW;Ambulation;Cueing for safety;Cueing for sequencing   Toileting- Clothing Manipulation and Hygiene: Supervision/safety;Sit to/from stand;Cueing for safety;Cueing for sequencing     Tub/Shower Transfer Details (indicate cue type and reason): verbalized  safety Functional mobility during ADLs: Supervision/safety;Cueing for safety;Rolling walker General ADL Comments: reviewed energy conservation with ADL activity                Cognition Arousal/Alertness: Awake/alert Behavior During Therapy: WFL for tasks assessed/performed Overall Cognitive Status: Within Functional Limits for tasks assessed                                                     Pertinent Vitals/ Pain       Pain Assessment: No/denies pain         Frequency  Min 2X/week        Progress Toward Goals  OT Goals(current goals can now be found in the care plan section)  Progress towards OT goals: Progressing toward goals     Plan Discharge plan remains appropriate    Co-evaluation                 AM-PAC PT "6 Clicks" Daily Activity     Outcome Measure   Help from another person eating meals?: None Help from another person taking care of personal grooming?: None Help from another person toileting, which includes using toliet, bedpan, or urinal?: None Help from another person bathing (including washing, rinsing, drying)?: A Little Help from another person to put on and taking off regular upper body  clothing?: None Help from another person to put on and taking off regular lower body clothing?: A Little 6 Click Score: 22    End of Session Equipment Utilized During Treatment: Rolling walker  OT Visit Diagnosis: Unsteadiness on feet (R26.81);Muscle weakness (generalized) (M62.81)   Activity Tolerance Patient tolerated treatment well   Patient Left in chair   Nurse Communication Mobility status        Time: [id number] OT Time Calculation (min): 32 min  Charges: OT General Charges $OT Visit: 1 Visit OT Treatments $Self Care/Home Management : 23-37 mins  [place]ori Batina Doug[place], [place] [phone]   Einar CrowEDDING, Hope Brandenburger D 10/27/2016, 1:59 PM

## 2016-10-27 NOTE — Progress Notes (Signed)
I agree with the previous Nurse's assessment 

## 2016-10-27 NOTE — Progress Notes (Signed)
ANTICOAGULATION CONSULT NOTE Pharmacy Consult for Warfarin Indication: atrial fibrillation  Allergies  Allergen Reactions  . Lorazepam Other (See Comments)    Altered mental status, confusion  . Sulfa Antibiotics Anaphylaxis    Patient Measurements: Height: 5\' 8"  (172.7 cm) Weight: 216 lb 12.8 oz (98.3 kg) IBW/kg (Calculated) : 68.4  Vital Signs: Temp: 97.9 F (36.6 C) (10/25 0556) Temp Source: Oral (10/25 0556) BP: 145/84 (10/25 0556) Pulse Rate: 83 (10/25 0556)  Labs:  Recent Labs  10/25/16 0030  10/25/16 0456 10/26/16 0455 10/27/16 0422  HGB 8.2*  --   --   --  8.5*  HCT 25.4*  --   --   --  25.9*  PLT 153  --   --   --  156  LABPROT  --   < > 26.5* 24.8* 25.5*  INR  --   < > 2.46 2.26 2.35  CREATININE 1.49*  --   --  1.49* 1.47*  < > = values in this interval not displayed.  Estimated Creatinine Clearance: 52.4 mL/min (A) (by C-G formula based on SCr of 1.47 mg/dL (H)).   Assessment: 71 year old male on chronic warfarin for afib admitted with shortness of breath and lower extremity swelling.  Home dose of warfarin reported as 7.5mg  daily, INR on admission therapeutic at 2.3.  Pharmacy is consulted to continue dosing while inpatient.  10/27/2016 INR therapeutic at 2.35. No bleeding reported. CBC stable.  Goal of Therapy:  INR 2-3 Monitor platelets by anticoagulation protocol: Yes   Plan:   Warfarin 7.5mg  daily as per home regimen  Daily PT/INR  Herby AbrahamMichelle T. Hareem Surowiec, Pharm.D. [phone] 10/27/2016 8:20 AM

## 2016-10-28 DIAGNOSIS — N183 Chronic kidney disease, stage 3 (moderate): Secondary | ICD-10-CM

## 2016-10-28 LAB — CBC
HCT: 27.7 % — ABNORMAL LOW (ref 39.0–52.0)
HEMOGLOBIN: 8.8 g/dL — AB (ref 13.0–17.0)
MCH: 29 pg (ref 26.0–34.0)
MCHC: 31.8 g/dL (ref 30.0–36.0)
MCV: 91.4 fL (ref 78.0–100.0)
PLATELETS: 168 10*3/uL (ref 150–400)
RBC: 3.03 MIL/uL — AB (ref 4.22–5.81)
RDW: 16.5 % — ABNORMAL HIGH (ref 11.5–15.5)
WBC: 9.1 10*3/uL (ref 4.0–10.5)

## 2016-10-28 LAB — GASTROINTESTINAL PANEL BY PCR, STOOL (REPLACES STOOL CULTURE)
Adenovirus F40/41: NOT DETECTED
Astrovirus: NOT DETECTED
CAMPYLOBACTER SPECIES: NOT DETECTED
Cryptosporidium: NOT DETECTED
Cyclospora cayetanensis: NOT DETECTED
ENTEROTOXIGENIC E COLI (ETEC): NOT DETECTED
Entamoeba histolytica: NOT DETECTED
Enteroaggregative E coli (EAEC): NOT DETECTED
Enteropathogenic E coli (EPEC): NOT DETECTED
Giardia lamblia: NOT DETECTED
NOROVIRUS GI/GII: NOT DETECTED
PLESIMONAS SHIGELLOIDES: NOT DETECTED
ROTAVIRUS A: NOT DETECTED
SALMONELLA SPECIES: NOT DETECTED
SAPOVIRUS (I, II, IV, AND V): NOT DETECTED
SHIGA LIKE TOXIN PRODUCING E COLI (STEC): NOT DETECTED
SHIGELLA/ENTEROINVASIVE E COLI (EIEC): NOT DETECTED
Vibrio cholerae: NOT DETECTED
Vibrio species: NOT DETECTED
Yersinia enterocolitica: NOT DETECTED

## 2016-10-28 LAB — BASIC METABOLIC PANEL
ANION GAP: 8 (ref 5–15)
BUN: 36 mg/dL — AB (ref 6–20)
CO2: 29 mmol/L (ref 22–32)
Calcium: 8.6 mg/dL — ABNORMAL LOW (ref 8.9–10.3)
Chloride: 107 mmol/L (ref 101–111)
Creatinine, Ser: 1.42 mg/dL — ABNORMAL HIGH (ref 0.61–1.24)
GFR, EST AFRICAN AMERICAN: 56 mL/min — AB (ref >60)
GFR, EST NON AFRICAN AMERICAN: 48 mL/min — AB (ref >60)
Glucose, Bld: 125 mg/dL — ABNORMAL HIGH (ref 65–99)
POTASSIUM: 3.7 mmol/L (ref 3.5–5.1)
SODIUM: 144 mmol/L (ref 135–145)

## 2016-10-28 LAB — PROTIME-INR
INR: 2.51
PROTHROMBIN TIME: 26.9 s — AB (ref 11.4–15.2)

## 2016-10-28 MED ORDER — VANCOMYCIN 50 MG/ML ORAL SOLUTION: 125.0000 mg | Freq: Four times a day (QID) | ORAL | 0 refills | Status: DC

## 2016-10-28 MED ORDER — FUROSEMIDE 40 MG PO TABS: 40.0000 mg | ORAL_TABLET | Freq: Two times a day (BID) | ORAL | 1 refills | Status: DC

## 2016-10-28 MED ORDER — POTASSIUM CHLORIDE CRYS ER 20 MEQ PO TBCR: 20.0000 meq | EXTENDED_RELEASE_TABLET | Freq: Two times a day (BID) | ORAL | 0 refills | Status: DC

## 2016-10-28 MED ORDER — DILTIAZEM HCL ER COATED BEADS 180 MG PO CP24: 180.0000 mg | ORAL_CAPSULE | Freq: Every day | ORAL | 1 refills | Status: DC

## 2016-10-28 NOTE — Care Management Important Message (Signed)
Important Message  Patient Details  Name: Azzie AlmasGerald Willam Frost MRN: [id number] Date of Birth: February 19, 1945   Medicare Important Message Given:  Yes    Caren MacadamFuller, Julie-Anne Torain 10/28/2016, 11:31 AMImportant Message  Patient Details  Name: Azzie AlmasGerald Willam Shindler MRN: [id number] Date of Birth: February 19, 1945   Medicare Important Message Given:  Yes    Caren MacadamFuller, Page Lancon 10/28/2016, 11:31 AM

## 2016-10-28 NOTE — Progress Notes (Signed)
ANTICOAGULATION CONSULT NOTE Pharmacy Consult for Warfarin Indication: atrial fibrillation  Allergies  Allergen Reactions  . Lorazepam Other (See Comments)    Altered mental status, confusion  . Sulfa Antibiotics Anaphylaxis    Patient Measurements: Height: 5\' 8"  (172.7 cm) Weight: 216 lb 8 oz (98.2 kg) IBW/kg (Calculated) : 68.4  Vital Signs: Temp: 98.1 F (36.7 C) (10/26 0515) Temp Source: Oral (10/26 0515) BP: 129/89 (10/26 0515) Pulse Rate: 81 (10/26 0515)  Labs:  Recent Labs  10/26/16 0455 10/27/16 0422 10/28/16 0430  HGB  --  8.5* 8.8*  HCT  --  25.9* 27.7*  PLT  --  156 168  LABPROT 24.8* 25.5* 26.9*  INR 2.26 2.35 2.51  CREATININE 1.49* 1.47* 1.42*    Estimated Creatinine Clearance: 54.2 mL/min (A) (by C-G formula based on SCr of 1.42 mg/dL (H)).   Assessment: 71 year old male on chronic warfarin for afib admitted with shortness of breath and lower extremity swelling.  Home dose of warfarin reported as 7.5mg  daily, INR on admission therapeutic at 2.3.  Pharmacy is consulted to continue dosing while inpatient.  10/28/2016 INR remains therapeutic at 2.51. No bleeding reported. CBC stable.   Goal of Therapy:  INR 2-3 Monitor platelets by anticoagulation protocol: Yes   Plan:   Warfarin 7.5mg  daily as per home regimen  Change daily PT/INR to MWF INR  Herby AbrahamMichelle T. Rayleen Wyrick, Pharm.D. [phone] 10/28/2016 7:46 AM

## 2016-10-28 NOTE — Discharge Summary (Addendum)
Physician Discharge Summary  Alexander Camacho AOZ:[id number] DOB: Oct 12, 1945 DOA: 10/24/2016  PCP: Patient, No Pcp Per  Admit date: 10/24/2016 Discharge date: 10/28/2016  Recommendations for Outpatient Follow-up:  1. Pt will need to follow up with PCP in 1 week post discharge 2. Please obtain BMP to evaluate electrolytes and kidney function 3. Please also check CBC to evaluate Hg and Hct levels 4. Please note changes in medications: pt started on Cardizem and lasix per cardiology 5. Norvasc was stopped 6. Pt also on coumadin and will need Pt/INR checks to ensure correct coumadin dosing  7. Pt advised to stop Protonix until C. Diff resolved   Discharge Diagnoses:  Principal Problem:   CHF (congestive heart failure) (HCC) Active Problems:   Atrial fibrillation (HCC)   Hypertension   Hypothyroidism  Discharge Condition: Stable  Diet recommendation: Heart healthy diet discussed in details   History of present illness:  Patient is 71 year old male with hypertension, hypothyroidism, BPH, presented to [facility] emergency department with main concern of several days duration of progressively worsening dyspnea at rest as well as exertion. Patient was recently discharged from rehabilitation facility after prolonged hospitalization in [place] related to pneumonia and complicated by atrial fibrillation, renal failure requiring short-term dialysis. Daughter at bedside reports patient's baseline weight from recent discharge from rehabilitation was 235 pounds. Weight prior to that is not known.  In emergency department, patient was given Lasix 40 mg IV 1 dose and diuresis 1600 mL after initial dose.  Assessment & Plan: Acute diastolic CHF - Possibly triggered by atrial fibrillation and hypertension - Patient clinically improving on Lasix  And has been successfully transitioned from IV to PO regimen  - Cr overall stable  - Weight trend since admission      [facility] Weights    10/25/16 1102 10/26/16 0515 10/27/16 0556  Weight: 103.8 kg (228 lb 13.4 oz) 99.4 kg (219 lb 2.2 oz) 98.3 kg (216 lb 12.8 oz)  - cardiology cleared for discharge   A. Fib  - Rate controlled at this time, continue metoprolol and Coumadin - cardiology added Cardizem   Physical deconditioning - physical therapy saw in consultation, recommend cardiopulmonary rehabilitation  Acute kidney injury imposed on likely CKD stage III - per daughter's report, patient has required short-term dialysis on recent admission - Patient's daughter report his kidney injury was caused by contrast given with CT scans - review of records indicate creatinine on recent discharge was around 1.5 - Cr overall stable   Diarrhea - per daughter pt with hx of multiple recent ABX use and intermittent diarrhea in the past several weeks - positive C. Diff - complete therapy with oral vancomycin   Obesity - Body mass index is 33.32 kg/m.  DVT prophylaxis: Coumadin Code Status: full code Family Communication: patient and his daughter updated at bedside Disposition Plan: home   Consultants:   Cardiology   Procedures:   None   Procedures/Studies: Dg Chest 2 View  Result Date: 10/26/2016 CLINICAL DATA:  Follow-up CHF. EXAM: CHEST  2 VIEW COMPARISON:  10/25/2016 FINDINGS: Persistent basilar chest densities, right side greater the left. Right basilar densities are suggestive for combination of consolidation and pleural fluid. Subsegmental atelectasis and/or pleural fissure fluid in the mid right chest. Fullness along the right side of the mediastinum and right cardiac shadow is unchanged. Interstitial markings are slightly enlarged. Negative for pneumothorax. The trachea is midline. IMPRESSION: Persistent bibasilar chest densities, right side greater than left. Chest densities likely represent a combination of  consolidation and pleural fluid. Right pleural fluid may be loculated based on the configuration. Mild  fullness along the right side of the cardiac shadow and right mediastinum. Lymphadenopathy in this area cannot be excluded. Consider further characterization with a Chest CT with IV contrast. Interstitial markings are prominent and may represent mild interstitial edema. Electronically Signed   By: Richarda OverlieAdam  Henn M.D.   On: 10/26/2016 08:34   Dg Chest 2 View  Result Date: 10/25/2016 CLINICAL DATA:  71 year old male with shortness of breath. EXAM: CHEST  2 VIEW COMPARISON:  None. FINDINGS: There are small bilateral pleural effusions, right greater than left. There is associated partial compressive atelectasis of the lower lobes. Superimposed pneumonia is not excluded. Clinical correlation is recommended. There is no pneumothorax. There is cardiomegaly. There is silhouetting of the cardiac border by the pleuroparenchymal densities. No acute osseous pathology. IMPRESSION: Bilateral pleural effusions and bilateral lower lobe atelectatic changes versus infiltrate. Clinical correlation is recommended. Electronically Signed   By: Elgie CollardArash  Radparvar M.D.   On: 10/25/2016 00:30     Discharge Exam: Vitals:   10/27/16 2130 10/28/16 0515  BP: 133/81 129/89  Pulse: 79 81  Resp: 20 18  Temp: 98.7 F (37.1 C) 98.1 F (36.7 C)  SpO2: 96% 95%   Vitals:   10/27/16 1124 10/27/16 1714 10/27/16 2130 10/28/16 0515  BP: 130/87 133/86 133/81 129/89  Pulse:  81 79 81  Resp:  18 20 18   Temp:  98.1 F (36.7 C) 98.7 F (37.1 C) 98.1 F (36.7 C)  TempSrc:  Oral Oral Oral  SpO2:  97% 96% 95%  Weight:    98.2 kg (216 lb 8 oz)  Height:        General: Pt is alert, follows commands appropriately, not in acute distress Cardiovascular: Irregularly irregular rhythm, no murmurs  Respiratory: Clear to auscultation bilaterally, no wheezing, no crackles, no rhonchi Abdominal: Soft, non tender, non distended, bowel sounds +, no guarding Extremities: +1 bilateral LE edema with chronic venous stasis changes   Discharge  Instructions  Discharge Instructions    Diet - low sodium heart healthy    Complete by:  As directed    Increase activity slowly    Complete by:  As directed      Allergies as of 10/28/2016      Reactions   Lorazepam Other (See Comments)   Altered mental status, confusion   Sulfa Antibiotics Anaphylaxis      Medication List    STOP taking these medications   amLODipine 5 MG tablet Commonly known as:  NORVASC   pantoprazole 40 MG tablet Commonly known as:  PROTONIX   polyethylene glycol packet Commonly known as:  MIRALAX / GLYCOLAX     TAKE these medications   acidophilus Caps capsule Take 1 capsule by mouth daily.   diltiazem 180 MG 24 hr capsule Commonly known as:  CARDIZEM CD Take 1 capsule (180 mg total) by mouth at bedtime.   doxazosin 4 MG tablet Commonly known as:  CARDURA Take 4 mg by mouth at bedtime.   ferrous sulfate 325 (65 FE) MG tablet Take 325 mg by mouth every Monday, Wednesday, Friday, and Saturday at 6 PM.   furosemide 40 MG tablet Commonly known as:  LASIX Take 1 tablet (40 mg total) by mouth 2 (two) times daily.   gabapentin 100 MG capsule Commonly known as:  NEURONTIN Take 100 mg by mouth 3 (three) times daily.   levothyroxine 75 MCG tablet Commonly known as:  SYNTHROID, LEVOTHROID Take 75 mcg by mouth daily before breakfast.   metoprolol tartrate 100 MG tablet Commonly known as:  LOPRESSOR Take 100 mg by mouth 2 (two) times daily.   potassium chloride SA 20 MEQ tablet Commonly known as:  K-DUR,KLOR-CON Take 1 tablet (20 mEq total) by mouth 2 (two) times daily.   vancomycin 50 mg/mL oral solution Commonly known as:  VANCOCIN Take 2.5 mLs (125 mg total) by mouth every 6 (six) hours.   warfarin 7.5 MG tablet Commonly known as:  COUMADIN Take 7.5 mg by mouth daily at 6 PM.   zolpidem 10 MG tablet Commonly known as:  AMBIEN Take 10 mg by mouth at bedtime.         Follow-up Information    Chilton Si, MD Follow up.    Specialty:  Cardiology Why:  office will contact you Contact information: [address] [place] [address] [phone]        Dorothea Ogle, MD. Call.   Specialty:  Internal Medicine Why:  call me as needed with questions [phone] Contact information: [address] [place] [address] [phone]            The results of significant diagnostics from this hospitalization (including imaging, microbiology, ancillary and laboratory) are listed below for reference.     Microbiology: Recent Results (from the past 240 hour(s))  C difficile quick scan w PCR reflex     Status: Abnormal   Collection Time: 10/27/16  6:35 PM  Result Value Ref Range Status   C Diff antigen POSITIVE (A) NEGATIVE Final   C Diff toxin POSITIVE (A) NEGATIVE Final   C Diff interpretation Toxin producing C. difficile detected.  Final    Comment: CRITICAL RESULT CALLED TO, READ BACK BY AND VERIFIED WITH: DEUTCH,J. RN @1949  ON 10.25.18 BY COHEN,K      Labs: Basic Metabolic Panel:  Recent Labs Lab 10/25/16 0030 10/25/16 0841 10/26/16 0455 10/27/16 0422 10/28/16 0430  NA 142  --  144 144 144  K 4.6  --  3.5 3.6 3.7  CL 109  --  106 106 107  CO2 28  --  30 30 29   GLUCOSE 146*  --  125* 131* 125*  BUN 32*  --  31* 39* 36*  CREATININE 1.49*  --  1.49* 1.47* 1.42*  CALCIUM 8.0*  --  8.6* 8.6* 8.6*  MG  --  1.8  --   --   --   PHOS  --   --   --  4.2  --    Liver Function Tests:  Recent Labs Lab 10/25/16 0030 10/27/16 0422  AST 21  --   ALT 50  --   ALKPHOS 98  --   BILITOT 0.7  --   PROT 7.4  --   ALBUMIN 3.2* 3.2*   CBC:  Recent Labs Lab 10/25/16 0030 10/27/16 0422 10/28/16 0430  WBC 7.5 7.6 9.1  NEUTROABS 5.8  --   --   HGB 8.2* 8.5* 8.8*  HCT 25.4* 25.9* 27.7*  MCV 92.0 90.9 91.4  PLT 153 156 168   BNP (last 3 results)  Recent Labs  10/25/16 0030  BNP 799.0*   SIGNED: Time coordinating discharge: 60 minutes  Debbora Presto, MD  [facility] 10/28/2016, 10:59 AM Pager [phone]  If 7PM-7AM, please contact night-coverage [email or web address] Password [email or web address]

## 2016-10-28 NOTE — Progress Notes (Signed)
Progress Note  Patient Name: Alexander Camacho Date of Encounter: 10/28/2016  Primary Cardiologist: Dr. Duke Salvia (new)  Subjective   Feeling much better.  Continues to have diarrhea.   Inpatient Medications    Scheduled Meds: . acidophilus  1 capsule Oral Daily  . diltiazem  180 mg Oral QHS  . ferrous sulfate  325 mg Oral Q M,W,F,Sa-1800  . furosemide  40 mg Oral BID  . gabapentin  100 mg Oral TID  . levothyroxine  75 mcg Oral QAC breakfast  . metoprolol tartrate  100 mg Oral BID  . pantoprazole  40 mg Oral Daily  . potassium chloride  20 mEq Oral BID  . sodium chloride flush  3 mL Intravenous Q12H  . vancomycin  125 mg Oral Q6H  . warfarin  7.5 mg Oral q1800  . Warfarin - Pharmacist Dosing Inpatient   Does not apply q1800  . zolpidem  10 mg Oral QHS   Continuous Infusions: . sodium chloride     PRN Meds: sodium chloride, acetaminophen, ondansetron (ZOFRAN) IV, sodium chloride flush   Vital Signs    Vitals:   10/27/16 1124 10/27/16 1714 10/27/16 2130 10/28/16 0515  BP: 130/87 133/86 133/81 129/89  Pulse:  81 79 81  Resp:  18 20 18   Temp:  98.1 F (36.7 C) 98.7 F (37.1 C) 98.1 F (36.7 C)  TempSrc:  Oral Oral Oral  SpO2:  97% 96% 95%  Weight:    98.2 kg (216 lb 8 oz)  Height:        Intake/Output Summary (Last 24 hours) at 10/28/16 0821 Last data filed at 10/28/16 0524  Gross per 24 hour  Intake              120 ml  Output             1675 ml  Net            -1555 ml   Filed Weights   10/26/16 0515 10/27/16 0556 10/28/16 0515  Weight: 99.4 kg (219 lb 2.2 oz) 98.3 kg (216 lb 12.8 oz) 98.2 kg (216 lb 8 oz)    Telemetry    Atrial fibrillation.  Rates <100 bpm.  - Personally Reviewed  ECG    n/a - Personally Reviewed  Physical Exam   GEN: Well-appearing.  No acute distress.   Neck: No JVD Cardiac: Irregularly irregular.  No murmurs, rubs, or gallops.  Respiratory: Clear to auscultation bilaterally. GI: Soft, nontender, non-distended    MS: 1+ pitting edema to lower tibia bilaterally ; No deformity. Neuro:  Nonfocal  Psych: Normal affect   Labs    Chemistry Recent Labs Lab 10/25/16 0030 10/26/16 0455 10/27/16 0422 10/28/16 0430  NA 142 144 144 144  K 4.6 3.5 3.6 3.7  CL 109 106 106 107  CO2 28 30 30 29   GLUCOSE 146* 125* 131* 125*  BUN 32* 31* 39* 36*  CREATININE 1.49* 1.49* 1.47* 1.42*  CALCIUM 8.0* 8.6* 8.6* 8.6*  PROT 7.4  --   --   --   ALBUMIN 3.2*  --  3.2*  --   AST 21  --   --   --   ALT 50  --   --   --   ALKPHOS 98  --   --   --   BILITOT 0.7  --   --   --   GFRNONAA 45* 45* 46* 48*  GFRAA 53* 53* 54* 56*  ANIONGAP 5  8 8 8      Hematology Recent Labs Lab 10/25/16 0030 10/27/16 0422 10/28/16 0430  WBC 7.5 7.6 9.1  RBC 2.76* 2.85* 3.03*  HGB 8.2* 8.5* 8.8*  HCT 25.4* 25.9* 27.7*  MCV 92.0 90.9 91.4  MCH 29.7 29.8 29.0  MCHC 32.3 32.8 31.8  RDW 16.6* 16.5* 16.5*  PLT 153 156 168    Cardiac EnzymesNo results for input(s): TROPONINI in the last 168 hours.  Recent Labs Lab 10/25/16 0040  TROPIPOC 0.01     BNP Recent Labs Lab 10/25/16 0030  BNP 799.0*     DDimer No results for input(s): DDIMER in the last 168 hours.   Radiology    Dg Chest 2 View  Result Date: 10/26/2016 CLINICAL DATA:  Follow-up CHF. EXAM: CHEST  2 VIEW COMPARISON:  10/25/2016 FINDINGS: Persistent basilar chest densities, right side greater the left. Right basilar densities are suggestive for combination of consolidation and pleural fluid. Subsegmental atelectasis and/or pleural fissure fluid in the mid right chest. Fullness along the right side of the mediastinum and right cardiac shadow is unchanged. Interstitial markings are slightly enlarged. Negative for pneumothorax. The trachea is midline. IMPRESSION: Persistent bibasilar chest densities, right side greater than left. Chest densities likely represent a combination of consolidation and pleural fluid. Right pleural fluid may be loculated based on the  configuration. Mild fullness along the right side of the cardiac shadow and right mediastinum. Lymphadenopathy in this area cannot be excluded. Consider further characterization with a Chest CT with IV contrast. Interstitial markings are prominent and may represent mild interstitial edema. Electronically Signed   By: Richarda Overlie M.D.   On: 10/26/2016 08:34    Cardiac Studies   Echo 10/25/16: Study Conclusions  - Left ventricle: The cavity size was mildly dilated. There was   moderate concentric hypertrophy. Systolic function was normal.   The estimated ejection fraction was 55%. Wall motion was normal;   there were no regional wall motion abnormalities. The study was   not technically sufficient to allow evaluation of LV diastolic   dysfunction due to atrial fibrillation. - Aortic valve: Moderately calcified annulus. Trileaflet; mildly   thickened, mildly calcified leaflets. There was mild   regurgitation. - Mitral valve: Calcified annulus. There was mild regurgitation. - Left atrium: The atrium was moderately dilated. - Pulmonary arteries: PA peak pressure: 63 mm Hg (S). - Pericardium, extracardiac: There was a large left pleural   effusion.  Impressions:  - The right ventricular systolic pressure was increased consistent   with moderate pulmonary hypertension.  Patient Profile     [phone] with paroxysmal atrial fibrillation, hypertension, hypothyroidism, and CKD III here with atrial fibrillation with rapid ventricular response and acute diastolic heart failure.  He has subsequently been found to be positive for C. Diff.   Assessment & Plan    # Atrial fibrillation with RVR:  Heart rates well-controlled on diltiazem.  Continue diltiazem, metoprolol, and warfarin.  He will think about getting Medicare Part D so that he can have a DOAC.  Consider DCCV as an outpatient once stable and after 3 weeks of therapeutic anticoagulation.  He will need an outpatient sleep study.   # Acute  diastolic heart failure:  Volume status improving.  He was switched to oral lasix and continues to maintain a negative fluid balance.  Renal function remains stable.  Continue lasix 40mg  PO BID.     For questions or updates, please contact [facility] Please consult [email or web address] for contact info under Cardiology/STEMI.  Signed, Chilton Siiffany [place], MD  10/28/2016, 8:21 AM

## 2016-10-28 NOTE — Discharge Instructions (Signed)
STOP TAKING PROTONIX UNTIL TREATMENT FOR C. DIFFICILE COMPLETED   CONTINUE TAKING LASIX 40 MG TWICE DAILY AS RECOMMENDED BY CARDIOLOGIST   YOU WERE STARTED ON DILTIAZEM BY CARDIOLOGIST  YOU MUST COMPLETE TREATMENT FOR CLOSTRIDIUM DIFFICILE (VANCOMYCIN) FOR TOTAL 14 DAYS   STOP TAKING NORVASC  Heart Failure Heart failure means your heart has trouble pumping blood. This makes it hard for your body to work well. Heart failure is usually a long-term (chronic) condition. You must take good care of yourself and follow your doctor's treatment plan. Follow these instructions at home:  Take your heart medicine as told by your doctor. ? Do not stop taking medicine unless your doctor tells you to. ? Do not skip any dose of medicine. ? Refill your medicines before they run out. ? Take other medicines only as told by your doctor or pharmacist.  Stay active if told by your doctor. The elderly and people with severe heart failure should talk with a doctor about physical activity.  Eat heart-healthy foods. Choose foods that are without trans fat and are low in saturated fat, cholesterol, and salt (sodium). This includes fresh or frozen fruits and vegetables, fish, lean meats, fat-free or low-fat dairy foods, whole grains, and high-fiber foods. Lentils and dried peas and beans (legumes) are also good choices.  Limit salt if told by your doctor.  Cook in a healthy way. Roast, grill, broil, bake, poach, steam, or stir-fry foods.  Limit fluids as told by your doctor.  Weigh yourself every morning. Do this after you pee (urinate) and before you eat breakfast. Write down your weight to give to your doctor.  Take your blood pressure and write it down if your doctor tells you to.  Ask your doctor how to check your pulse. Check your pulse as told.  Lose weight if told by your doctor.  Stop smoking or chewing tobacco. Do not use gum or patches that help you quit without your doctor's  approval.  Schedule and go to doctor visits as told.  Nonpregnant women should have no more than 1 drink a day. Men should have no more than 2 drinks a day. Talk to your doctor about drinking alcohol.  Stop illegal drug use.  Stay current with shots (immunizations).  Manage your health conditions as told by your doctor.  Learn to manage your stress.  Rest when you are tired.  If it is really hot outside: ? Avoid intense activities. ? Use air conditioning or fans, or get in a cooler place. ? Avoid caffeine and alcohol. ? Wear loose-fitting, lightweight, and light-colored clothing.  If it is really cold outside: ? Avoid intense activities. ? Layer your clothing. ? Wear mittens or gloves, a hat, and a scarf when going outside. ? Avoid alcohol.  Learn about heart failure and get support as needed.  Get help to maintain or improve your quality of life and your ability to care for yourself as needed. Contact a doctor if:  You gain weight quickly.  You are more short of breath than usual.  You cannot do your normal activities.  You tire easily.  You cough more than normal, especially with activity.  You have any or more puffiness (swelling) in areas such as your hands, feet, ankles, or belly (abdomen).  You cannot sleep because it is hard to breathe.  You feel like your heart is beating fast (palpitations).  You get dizzy or light-headed when you stand up. Get help right away if:  You  have trouble breathing.  There is a change in mental status, such as becoming less alert or not being able to focus.  You have chest pain or discomfort.  You faint. This information is not intended to replace advice given to you by your health care provider. Make sure you discuss any questions you have with your health care provider. Document Released: 09/29/2007 Document Revised: 05/28/2015 Document Reviewed: 02/06/2012 Elsevier Interactive Patient Education  2017 [employer]

## 2016-11-04 ENCOUNTER — Telehealth: Payer: Self-pay | Admitting: Cardiovascular Disease

## 2016-11-04 NOTE — Telephone Encounter (Signed)
Closed Encounter  °

## 2016-11-07 ENCOUNTER — Encounter: Payer: Self-pay | Admitting: Cardiovascular Disease

## 2016-11-07 ENCOUNTER — Ambulatory Visit (INDEPENDENT_AMBULATORY_CARE_PROVIDER_SITE_OTHER): Payer: Medicare Other | Admitting: Cardiovascular Disease

## 2016-11-07 VITALS — BP 120/71 | HR 76 | Ht 68.0 in | Wt 221.4 lb

## 2016-11-07 DIAGNOSIS — Z5181 Encounter for therapeutic drug level monitoring: Secondary | ICD-10-CM | POA: Diagnosis not present

## 2016-11-07 DIAGNOSIS — I110 Hypertensive heart disease with heart failure: Secondary | ICD-10-CM

## 2016-11-07 DIAGNOSIS — I5033 Acute on chronic diastolic (congestive) heart failure: Secondary | ICD-10-CM | POA: Diagnosis not present

## 2016-11-07 DIAGNOSIS — I4891 Unspecified atrial fibrillation: Secondary | ICD-10-CM

## 2016-11-07 MED ORDER — FUROSEMIDE 80 MG PO TABS: 80.0000 mg | ORAL_TABLET | Freq: Two times a day (BID) | ORAL | 5 refills | Status: DC

## 2016-11-07 NOTE — Progress Notes (Signed)
Cardiology Office Note   Date:  11/07/2016   ID:  Alexander Camacho, DOB [birth year], MRN [id number]  PCP:  Patient, No Pcp Per  Cardiologist:   Chilton Siiffany Volin, MD   No chief complaint on file.    History of Present Illness: Alexander Camacho is a 71 y.o. male with chronic diastolic heart failure, severe pulmonary hypertension, paroxysmal atrial fibrillation, hypertension, hypothyroidism, and  who presents for follow-up.  He was hospitalized 10/2016 with acute diastolic heart failure in the setting of atrial fibrillation with rapid ventricular response.  He was previously hospitalized 09/2016 in [place] pneumonia.   That hospitalization was complicated by atrial fibrillation with rapid ventricular response and acute renal failure requiring short-term dialysis.  This was thought to be from contrast nephropathy after a CT scan.  He remained in atrial fibrillation throughout the hospitalization and was started on warfarin.  He was previously on metoprolol prior to admission and this was continued.  At discharge he had no lower extremity edema and his heart rate was reportedly well-controlled.  He was not aware of being in atrial fibrillation.  He was discharged to a rehab facility and just left there on 10/17.  By the time he left the rehab he had significant lower extremity edema.  After being discharged from rehab he developed worsened shortness of breath.  He presented to [facility] where he was noted to be in atrial fibrillation with rapid ventricular response.  An echocardiogram was performed 10/2016 that revealed LVEF 55% with mild aortic regurgitation, mild mitral regurgitation, and a moderately dilated left atrium.  PASP was 63 mmHg.  He was diuresed with IV Lasix and diltiazem was added to his regimen.   Since being discharged from the hospital Mr. Alexander Camacho has been feeling much better.  He denies shortness of breath or orthopnea.  He continues to sleep in a  chair for comfort.  This is been ongoing for years.  He has been wearing compression socks, which helps.  His weight has been pretty stable between 213 and 216 lb.  He has been limiting his salt.  Given that he had some of his diarrhea he was not limiting his fluid intake.  His diarrhea is finally improving.   Past Medical History:  Diagnosis Date  . Acute renal failure (HCC)   . Atrial fibrillation (HCC)   . Blood transfusion without reported diagnosis   . Hypertension   . Pneumonia     Past Surgical History:  Procedure Laterality Date  . APPENDECTOMY       Current Outpatient Medications  Medication Sig Dispense Refill  . acidophilus (RISAQUAD) CAPS capsule Take 1 capsule by mouth daily.    [name]. diltiazem (CARDIZEM CD) 180 MG 24 hr capsule Take 1 capsule (180 mg total) by mouth at bedtime. 30 capsule 1  . doxazosin (CARDURA) 4 MG tablet Take 4 mg by mouth at bedtime.    . ferrous sulfate 325 (65 FE) MG tablet Take 325 mg by mouth every Monday, Wednesday, Friday, and Saturday at 6 PM.    . furosemide (LASIX) 40 MG tablet Take 1 tablet (40 mg total) by mouth 2 (two) times daily. 60 tablet 1  . gabapentin (NEURONTIN) 100 MG capsule Take 100 mg by mouth 3 (three) times daily.    [name]. levothyroxine (SYNTHROID, LEVOTHROID) 75 MCG tablet Take 75 mcg by mouth daily before breakfast.    . metoprolol tartrate (LOPRESSOR) 100 MG tablet Take 100 mg by mouth 2 (  two) times daily.    . potassium chloride SA (K-DUR,KLOR-CON) 20 MEQ tablet Take 1 tablet (20 mEq total) by mouth 2 (two) times daily. 60 tablet 0  . vancomycin (VANCOCIN) 50 mg/mL oral solution Take 2.5 mLs (125 mg total) by mouth every 6 (six) hours. 150 mL 0  . warfarin (COUMADIN) 7.5 MG tablet Take 7.5 mg by mouth daily at 6 PM.    . zolpidem (AMBIEN) 10 MG tablet Take 10 mg by mouth at bedtime.     No current facility-administered medications for this visit.     Allergies:   Lorazepam and Sulfa antibiotics    Social History:  The  patient  reports that  has never smoked. he has never used smokeless tobacco. He reports that he does not drink alcohol or use drugs.   Family History:  The patient's family history is not on file.    ROS:  Please see the history of present illness.   Otherwise, review of systems are positive for none.   All other systems are reviewed and negative.    PHYSICAL EXAM: VS:  BP 120/71   Pulse 76   Ht 5\' 8"  (1.727 m)   Wt 100.4 kg (221 lb 6.4 oz)   BMI 33.66 kg/m  , BMI Body mass index is 33.66 kg/m. GENERAL:  Well appearing.  No acute distress. HEENT: Pupils equal round and reactive, fundi not visualized, oral mucosa unremarkable NECK:  JVP 3cm above the clavicle at 45 degrees. Waveform within normal limits, carotid upstroke brisk and symmetric, no bruits, no thyromegaly LYMPHATICS:  No cervical adenopathy LUNGS:  Clear to auscultation bilaterally HEART: Irregularly irregularPMI not displaced or sustained,S1 and S2 within normal limits, no S3, no S4, no clicks, no rubs, no murmurs ABD:  Flat, positive bowel sounds normal in frequency in pitch, no bruits, no rebound, no guarding, no midline pulsatile mass, no hepatomegaly, no splenomegaly EXT:  2 plus pulses throughout, 2+ pitting edema to the upper tibia bilaterally, no cyanosis no clubbing SKIN:  No rashes no nodules NEURO:  Cranial nerves II through XII grossly intact, motor grossly intact throughout PSYCH:  Cognitively intact, oriented to person place and time   EKG:  EKG is not ordered today.   Echo 10/28/16: Study Conclusions  - Left ventricle: The cavity size was mildly dilated. There was moderate concentric hypertrophy. Systolic function was normal. The estimated ejection fraction was 55%. Wall motion was normal; there were no regional wall motion abnormalities. The study was not technically sufficient to allow evaluation of LV diastolic dysfunction due to atrial fibrillation. - Aortic valve: Moderately  calcified annulus. Trileaflet; mildly thickened, mildly calcified leaflets. There was mild regurgitation. - Mitral valve: Calcified annulus. There was mild regurgitation. - Left atrium: The atrium was moderately dilated. - Pulmonary arteries: PA peak pressure: 63 mm Hg (S). - Pericardium, extracardiac: There was a large left pleural effusion.  Impressions:  - The right ventricular systolic pressure was increased consistent with moderate pulmonary hypertension.   Recent Labs: 10/25/2016: ALT 50; B Natriuretic Peptide 799.0; Magnesium 1.8; TSH 4.788 10/28/2016: BUN 36; Creatinine, Ser 1.42; Hemoglobin 8.8; Platelets 168; Potassium 3.7; Sodium 144    Lipid Panel No results found for: CHOL, TRIG, HDL, CHOLHDL, VLDL, LDLCALC, LDLDIRECT    Wt Readings from Last 3 Encounters:  10/28/16 98.2 kg (216 lb 8 oz)      ASSESSMENT AND PLAN:  # Persistent atrial fibrillation: Mr. Corallo remains in atrial fibrillation today.  His rate is well-controlled.  He is fairly asymptomatic though he has some fatigue.  It is unclear if this is due to his recurrent hospitalizations or atrial fibrillation.  Continue diltiazem and metoprolol.  We will start checking his INR is in our clinic.  Goal is 2-3.  Once he is more euvolemic and his INR has been therapeutic for 3 weeks we will plan to perform a cardioversion to see if this helps his energy level  # Acute on chronic diastolic heart failure:  Mr. Roanhorse remains volume overloaded.  He has been limiting his salt intake to 2 g daily.  He will start limiting his fluids to 2 L.  We will increase furosemide to 80 mg twice daily.  Continue potassium at current level and check basic metabolic panel in 1 week.  Continue metoprolol as above.  # Hypertension:  Blood pressure is well-controlled on diltiazem, doxazosin, furosemide, and metoprolol.  # CKD III: Baseline creatinine 1.4.  Monitor creatinine closely with diuresis.  Current medicines are  reviewed at length with the patient today.  The patient does not have concerns regarding medicines.  The following changes have been made: Increase furosemide to 80 mg twice daily  Labs/ tests ordered today include:  No orders of the defined types were placed in this encounter.    Disposition:   FU with Decari Duggar C. Duke Salvia, MD, [facility] in 1 week.     This note was written with the assistance of speech recognition software.  Please excuse any transcriptional errors.  Signed, Jonita Hirota C. Duke Salvia, MD, [facility]  11/07/2016 7:38 AM    [facility]

## 2016-11-07 NOTE — Patient Instructions (Signed)
Medication Instructions:  INCREASE YOUR FUROSEMIDE TO 80 MG TWICE A DAY   Labwork: BMET/CBC/PT/INR TODAY AT YOUR PRIMARY CARE DOCTOR HAVE LABS FAXED TO [phone]  Testing/Procedures: NONE  Follow-Up: Your physician recommends that you schedule a follow-up appointment in: 1 WEEK   If you need a refill on your cardiac medications before your next appointment, please call your pharmacy.

## 2016-11-16 ENCOUNTER — Telehealth: Payer: Self-pay | Admitting: *Deleted

## 2016-11-16 ENCOUNTER — Ambulatory Visit (INDEPENDENT_AMBULATORY_CARE_PROVIDER_SITE_OTHER): Payer: Medicare Other | Admitting: Cardiovascular Disease

## 2016-11-16 ENCOUNTER — Encounter: Payer: Self-pay | Admitting: Cardiovascular Disease

## 2016-11-16 VITALS — BP 139/81 | HR 66 | Ht 68.0 in | Wt 213.8 lb

## 2016-11-16 DIAGNOSIS — I110 Hypertensive heart disease with heart failure: Secondary | ICD-10-CM

## 2016-11-16 DIAGNOSIS — N183 Chronic kidney disease, stage 3 unspecified: Secondary | ICD-10-CM

## 2016-11-16 DIAGNOSIS — Z5181 Encounter for therapeutic drug level monitoring: Secondary | ICD-10-CM | POA: Diagnosis not present

## 2016-11-16 DIAGNOSIS — I4891 Unspecified atrial fibrillation: Secondary | ICD-10-CM | POA: Diagnosis not present

## 2016-11-16 DIAGNOSIS — I5033 Acute on chronic diastolic (congestive) heart failure: Secondary | ICD-10-CM | POA: Diagnosis not present

## 2016-11-16 NOTE — Progress Notes (Signed)
Cardiology Office Note   Date:  11/07/2016   ID:  Alexander, Camacho 06-09-1945, MRN [id number]  PCP:  Patient, No Pcp Per  Cardiologist:   Chilton Si, MD   No chief complaint on file.    History of Present Illness: Alexander Camacho is a 71 y.o. male with chronic diastolic heart failure, severe pulmonary hypertension, paroxysmal atrial fibrillation, hypertension, hypothyroidism, and  who presents for follow-up.  He was hospitalized 10/2016 with acute diastolic heart failure in the setting of atrial fibrillation with rapid ventricular response.  He was previously hospitalized 09/2016 in [place] for pneumonia.   That hospitalization was complicated by atrial fibrillation with rapid ventricular response and acute renal failure requiring short-term dialysis.  This was thought to be from contrast nephropathy after a CT scan.  He remained in atrial fibrillation throughout the hospitalization and was started on warfarin.  He was previously on metoprolol prior to admission and this was continued.  At discharge he had no lower extremity edema and his heart rate was reportedly well-controlled.  He was not aware of being in atrial fibrillation.  He was discharged to a rehab facility and just left there on 10/17.  By the time he left the rehab he had significant lower extremity edema.  After being discharged from rehab he developed worsened shortness of breath.  He presented to [facility] hospital where he was noted to be in atrial fibrillation with rapid ventricular response.  An echocardiogram was performed 10/2016 that revealed LVEF 55% with mild aortic regurgitation, mild mitral regurgitation, and a moderately dilated left atrium.  PASP was 63 mmHg.  He was diuresed with IV Lasix and diltiazem was added to his regimen.   Since his last appointment [name] has been feeling well.  His edema is getting better and he doesn't feel as distended.  He denies orthopnea, though  he continues to sleep in a recliner.  He walks around his house for exercise daily but does not get any formal exercise.  He notes that he continues to get very tired quickly, though this is improved significantly since his hospitalization.  He denies chest pain, palpitations, lightheadedness, or dizziness.  He wonders whether he could participate in cardiac rehab program.  His main goal is to get strong enough to move back to [place] independently.  However, his daughter is concerned that he is not yet strong enough to do grocery shopping and other ADLs.  Past Medical History:  Diagnosis Date  . Acute renal failure (HCC)   . Atrial fibrillation (HCC)   . Blood transfusion without reported diagnosis   . Hypertension   . Pneumonia     Past Surgical History:  Procedure Laterality Date  . APPENDECTOMY       Current Outpatient Medications  Medication Sig Dispense Refill  . acidophilus (RISAQUAD) CAPS capsule Take 1 capsule by mouth daily.    [name] diltiazem (CARDIZEM CD) 180 MG 24 hr capsule Take 1 capsule (180 mg total) by mouth at bedtime. 30 capsule 1  . doxazosin (CARDURA) 4 MG tablet Take 4 mg by mouth at bedtime.    . ferrous sulfate 325 (65 FE) MG tablet Take 325 mg by mouth every Monday, Wednesday, Friday, and Saturday at 6 PM.    . furosemide (LASIX) 40 MG tablet Take 1 tablet (40 mg total) by mouth 2 (two) times daily. 60 tablet 1  . gabapentin (NEURONTIN) 100 MG capsule Take 100 mg by mouth 3 (three) times  daily.    . levothyroxine (SYNTHROID, LEVOTHROID) 75 MCG tablet Take 75 mcg by mouth daily before breakfast.    . metoprolol tartrate (LOPRESSOR) 100 MG tablet Take 100 mg by mouth 2 (two) times daily.    . potassium chloride SA (K-DUR,KLOR-CON) 20 MEQ tablet Take 1 tablet (20 mEq total) by mouth 2 (two) times daily. 60 tablet 0  . vancomycin (VANCOCIN) 50 mg/mL oral solution Take 2.5 mLs (125 mg total) by mouth every 6 (six) hours. 150 mL 0  . warfarin (COUMADIN) 7.5 MG tablet Take  7.5 mg by mouth daily at 6 PM.    . zolpidem (AMBIEN) 10 MG tablet Take 10 mg by mouth at bedtime.     No current facility-administered medications for this visit.     Allergies:   Lorazepam and Sulfa antibiotics    Social History:  The patient  reports that  has never smoked. he has never used smokeless tobacco. He reports that he does not drink alcohol or use drugs.   Family History:  The patient's family history is not on file.    ROS:  Please see the history of present illness.   Otherwise, review of systems are positive for none.   All other systems are reviewed and negative.    PHYSICAL EXAM: VS:  BP 139/81   Pulse 66   Ht 5\' 8"  (1.727 m)   Wt 97 kg (213 lb 12.8 oz)   BMI 32.51 kg/m  , BMI Body mass index is 32.51 kg/m. GENERAL:  Well appearing HEENT: Pupils equal round and reactive, fundi not visualized, oral mucosa unremarkable NECK: JVP 1 cm above the clavicle at 45 degrees.  Waveform within normal limits, carotid upstroke brisk and symmetric, no bruits, no thyromegaly LYMPHATICS:  No cervical adenopathy LUNGS:  Clear to auscultation bilaterally HEART: Irregularly irregular.  PMI not displaced or sustained,S1 and S2 within normal limits, no S3, no S4, no clicks, no rubs, no murmurs ABD:  Flat, positive bowel sounds normal in frequency in pitch, no bruits, no rebound, no guarding, no midline pulsatile mass, no hepatomegaly, no splenomegaly EXT:  2 plus pulses throughout, 1+ pitting edema to the ankles bilaterally, no cyanosis no clubbing SKIN:  No rashes no nodules NEURO:  Cranial nerves II through XII grossly intact, motor grossly intact throughout PSYCH:  Cognitively intact, oriented to person place and time    EKG:  EKG is ordered today. 11/16/16: Atrial fibrillation.  Rate 66 bpm.  L axis deviation.   Echo 10/28/16: Study Conclusions  - Left ventricle: The cavity size was mildly dilated. There was moderate concentric hypertrophy. Systolic function was  normal. The estimated ejection fraction was 55%. Wall motion was normal; there were no regional wall motion abnormalities. The study was not technically sufficient to allow evaluation of LV diastolic dysfunction due to atrial fibrillation. - Aortic valve: Moderately calcified annulus. Trileaflet; mildly thickened, mildly calcified leaflets. There was mild regurgitation. - Mitral valve: Calcified annulus. There was mild regurgitation. - Left atrium: The atrium was moderately dilated. - Pulmonary arteries: PA peak pressure: 63 mm Hg (S). - Pericardium, extracardiac: There was a large left pleural effusion.  Impressions:  - The right ventricular systolic pressure was increased consistent with moderate pulmonary hypertension.   Recent Labs: 10/25/2016: ALT 50; B Natriuretic Peptide 799.0; Magnesium 1.8; TSH 4.788 10/28/2016: BUN 36; Creatinine, Ser 1.42; Hemoglobin 8.8; Platelets 168; Potassium 3.7; Sodium 144  11/07/16: WBC 9, hgb 8.6, hct 26, platelets 313    Lipid Panel  No results found for: CHOL, TRIG, HDL, CHOLHDL, VLDL, LDLCALC, LDLDIRECT    Wt Readings from Last 3 Encounters:  10/28/16 98.2 kg (216 lb 8 oz)      ASSESSMENT AND PLAN:  # Persistent atrial fibrillation: [name] remains in atrial fibrillation today.  Rates are well-controlled.  He does not have any palpitations but continues to be very fatigued.  His INR was subtherapeutic when checked last week.  We will repeat a level today.  He will need 3 weeks of anticoagulation prior to DCCV and at least 4 weeks after.  He also needs to be evaluated by GI and have a colonoscopy for anemia which is new since his hospitalization.  He prefers to have this performed once he gets back to [place].  Continue diltiazem, metoprolol and warfarin.  # Acute on chronic diastolic heart failure:  Volume status improving he remains slightly volume overloaded., though   He has lost 8 pounds in the last week since he  was last seen here.  Continue furosemide at 80 mg daily.  We will repeat a basic metabolic panel today.  His potassium may need to be adjusted based on these findings.  Continue diltiazem and metoprolol.  # Hypertension: Continue diltiazem, doxazosin, furosemide, and metoprolol.  # CKD III: Baseline creatinine 1.4.  Monitor creatinine closely with diuresis.  Check BMP today.  Current medicines are reviewed at length with the patient today.  The patient does not have concerns regarding medicines.  The following changes have been made: none  Labs/ tests ordered today include:  No orders of the defined types were placed in this encounter.    Disposition:   FU with River Ambrosio C. Duke Salviaandolph, MD, [facility] in 1 month.   This note was written with the assistance of speech recognition software.  Please excuse any transcriptional errors.  Signed, Genette Huertas C. Duke Salviaandolph, MD, [facility]  11/07/2016 7:38 AM    [facility]

## 2016-11-16 NOTE — Patient Instructions (Signed)
Medication Instructions:  Your physician recommends that you continue on your current medications as directed. Please refer to the Current Medication list given to you today.  Labwork: BMET TODAY   Testing/Procedures: NONE  Follow-Up: Your physician recommends that you schedule a follow-up appointment in: 1 MONTH OV  Any Other Special Instructions Will Be Listed Below (If Applicable). WILL CALL AND SPEAK WITH CARDIAC REHAB TO SEE WHAT YOU MIGHT QUALIFY FOR AND OUR OFFICE OR REHAB WILL CALL YOU   If you need a refill on your cardiac medications before your next appointment, please call your pharmacy.

## 2016-11-16 NOTE — Telephone Encounter (Signed)
Patient was in office today and no protime done Spoke with patient and he will have to talk to his daughter since he has no transportation and call back to schedule

## 2016-11-17 ENCOUNTER — Other Ambulatory Visit: Payer: Self-pay | Admitting: Cardiovascular Disease

## 2016-11-17 LAB — BASIC METABOLIC PANEL
BUN/Creatinine Ratio: 29 — ABNORMAL HIGH (ref 10–24)
BUN: 38 mg/dL — ABNORMAL HIGH (ref 8–27)
CO2: 26 mmol/L (ref 20–29)
Calcium: 9.3 mg/dL (ref 8.6–10.2)
Chloride: 106 mmol/L (ref 96–106)
Creatinine, Ser: 1.33 mg/dL — ABNORMAL HIGH (ref 0.76–1.27)
GFR calc Af Amer: 62 mL/min/{1.73_m2} (ref >59)
GFR calc non Af Amer: 53 mL/min/{1.73_m2} — ABNORMAL LOW (ref >59)
Glucose: 117 mg/dL — ABNORMAL HIGH (ref 65–99)
Potassium: 4.3 mmol/L (ref 3.5–5.2)
Sodium: 145 mmol/L — ABNORMAL HIGH (ref 134–144)

## 2016-11-17 NOTE — Telephone Encounter (Signed)
Please review for refill, Thanks !  

## 2016-11-18 NOTE — Telephone Encounter (Signed)
Spoke with patient and he has appointment next week with Coumadin clinic

## 2016-11-23 ENCOUNTER — Ambulatory Visit (INDEPENDENT_AMBULATORY_CARE_PROVIDER_SITE_OTHER): Payer: Medicare Other | Admitting: Pharmacist

## 2016-11-23 DIAGNOSIS — I4891 Unspecified atrial fibrillation: Secondary | ICD-10-CM

## 2016-11-23 DIAGNOSIS — Z7901 Long term (current) use of anticoagulants: Secondary | ICD-10-CM

## 2016-11-23 LAB — POCT INR: INR: 1.6

## 2016-11-23 NOTE — Patient Instructions (Signed)
Warfarin Coagulopathy Warfarin (Coumadin) coagulopathy refers to bleeding that may occur as a complication of the medicine warfarin. Warfarin is an oral blood thinner (anticoagulant). Warfarin is used for medical conditions where thinning of the blood is needed to prevent blood clots. What are the causes? Bleeding is the most common and most serious complication of warfarin. The amount of bleeding is related to the warfarin dose and length of treatment. In addition, bleeding complications can also occur due to:  Intentional or accidental warfarin overdose.  Underlying medical conditions.  Dietary changes.  Medicine, herbal, supplement, or alcohol interactions.  What are the signs or symptoms? Severe bleeding while on warfarin may occur from any tissue or organ. Symptoms of the blood being too thin may include:  Bleeding from the nose or gums.  Blood in bowel movements which may appear as bright red, dark, or black tarry stools.  Blood in the urine which may appear as pink, red, or brown urine.  Unusual bruising or bruising easily.  A cut that does not stop bleeding within 10 minutes.  Vomiting blood or continuous nausea for more than 1 day.  Coughing up blood.  Broken blood vessels in your eye (subconjunctival hemorrhage).  Abdominal or back pain with or without flank bruising.  Sudden, severe headache.  Sudden weakness or numbness of the face, arm, or leg, especially on one side of the body.  Sudden confusion.  Trouble speaking (aphasia) or understanding.  Sudden trouble seeing in one or both eyes.  Sudden trouble walking.  Dizziness.  Loss of balance or coordination.  Vaginal bleeding.  Swelling or pain at an injection site.  Superficial fat tissue death (necrosis) which may cause skin scarring. This is more common in women and may first present as pain in the waist, thighs, or buttocks.  Follow these instructions at home:  Always contact your health care  provider of any concerns or signs of possible warfarin coagulopathy as soon as possible.  Take warfarin exactly as directed by your health care provider. It is recommended that you take your warfarin dose at the same time of the day. If you have been told to stop taking warfarin, do not resume taking warfarin until directed to do so by your health care provider. Follow your health care provider's instructions if you accidentally take an extra dose or miss a dose of warfarin. It is very important to take warfarin as directed since bleeding or blood clots could result in chronic or permanent injury, pain, or disability.  Keep all follow-up appointments with your health care provider as directed. It is very important to keep your appointments. Not keeping appointments could result in a chronic or permanent injury, pain, or disability because warfarin is a medicine that requires close monitoring.  While taking warfarin, you will need to have regular blood tests to measure your blood clotting time. These blood tests usually include both the prothrombin time (PT) and International Normalized Ratio (INR) tests. The PT and INR results allow your health care provider to adjust your dose of warfarin. The dose can change for many reasons. It is critically important that you have your PT and INR levels drawn exactly as directed. Your warfarin dose may stay the same or change depending on what the PT and INR results are. Be sure to follow up with your health care provider regarding your PT and INR test results and what your warfarin dosage should be.  Many medicines can interfere with warfarin and affect the PT and INR   results. You must tell your health care provider about any and all medicines you take. This includes all vitamins and supplements. Ask your health care provider before taking these. Prescription and over-the-counter medicine consistency is critical to warfarin management. It is important that potential  interactions are checked before you start a new medicine. Be especially cautious with aspirin and anti-inflammatory medicines. Ask your health care provider before taking these. Medicines such as antibiotics and acid-reducing medicine can interact with warfarin and can cause an increased warfarin effect. Warfarin can also interfere with the effectiveness of medicines you are taking. Do not take or discontinue any prescribed or over-the-counter medicine except on the advice of your health care provider or pharmacist.  Some vitamins, supplements, and herbal products interfere with the effectiveness of warfarin. Vitamin E may increase the anticoagulant effects of warfarin. Vitamin K can cause warfarin to be less effective. Do not take or discontinue any vitamin, supplement, or herbal product except on the advice of your health care provider or pharmacist.  Eat what you normally eat and keep the vitamin K content of your diet consistent. Avoid major changes in your diet, or notify your health care provider before changing your diet. Suddenly getting a lot more vitamin K could cause your blood to clot too quickly. A sudden decrease in vitamin K intake could cause your blood to clot too slowly. These changes in vitamin K intake could lead to dangerous blood clotsor to bleeding. To keep your vitamin K intake consistent, you must be aware of which foods contain moderate or high amounts of vitamin K. Some foods that are high in vitamin K include spinach, kale, broccoli, cabbage, greens, Brussels sprouts, asparagus, bok choy, coleslaw, and parsley. If you drink green tea, drink the same amount each day. Arrange a visit with a dietitian to answer your questions.  If you have a loss of appetite or get the stomach flu (viral gastroenteritis), talk to your health care provider as soon as possible. A decrease in your normal vitamin K intake can make you more sensitive to your usual dose of warfarin.  Some medical  conditions may increase your risk for bleeding while you are taking warfarin. A fever, diarrhea lasting more than a day, worsening heart failure, or worsening liver function are some medical conditions that could affect warfarin. Contact your health care provider if you have any of these medical conditions.  Be careful not to cut yourself when using sharp objects or while shaving.  Alcohol can change the body's ability to handle warfarin. It is best to avoid alcoholic drinks or consume only very small amounts while taking warfarin. Notify your health care provider if you change your alcohol intake. A sudden increase in alcohol use can increase your risk of bleeding. Chronic alcohol use can cause warfarin to be less effective.  Limit physical activities or sports that could result in a fall or cause injury.  Do not use warfarin if you are pregnant.  Inform all your health care providers and your dentist that you take warfarin.  Inform all health care providers if you are taking warfarin and aspirin or platelet inhibitor medicines such as clopidogrel, ticagrelor, or prasugrel. Use of these medicines in addition to warfarin can increase your risk of bleeding or death. Taking these medicines together should only be done under the direct care of your health care providers. Get help right away if:  You cough up blood.  You have dark or black stools or there is bright red   blood coming from your rectum.  You vomit blood or have nausea for more than 1 day.  You have blood in the urine or pink-colored urine.  You have unusual bruising or have increased bruising.  You have bleeding from the nose or gums that does not stop quickly.  You have a cut that does not stop bleeding within 2-3 minutes.  You have sudden weakness or numbness of the face, arm, or leg, especially on one side of the body.  You have sudden confusion.  You have trouble speaking (aphasia) or understanding.  You have sudden  trouble seeing in one or both eyes.  You have sudden trouble walking.  You have dizziness.  You have a loss of balance or coordination.  You have a sudden, severe headache.  You have a serious fall or head injury, even if you are not bleeding.  You have swelling or pain at an injection site.  You have unexplained tenderness or pain in the abdomen, back, waist, thighs, or buttocks. Any of these symptoms may represent a serious problem that is an emergency. Do not wait to see if the symptoms will go away. Get medical help right away. Call your local emergency services (911 in [place] Do not drive yourself to the hospital. This information is not intended to replace advice given to you by your health care provider. Make sure you discuss any questions you have with your health care provider. Document Released: 11/28/2005 Document Revised: 05/28/2015 Document Reviewed: 05/31/2011 Elsevier Interactive Patient Education  2018 [employer]  

## 2016-12-07 ENCOUNTER — Ambulatory Visit (INDEPENDENT_AMBULATORY_CARE_PROVIDER_SITE_OTHER): Payer: Medicare Other | Admitting: Pharmacist

## 2016-12-07 DIAGNOSIS — I4891 Unspecified atrial fibrillation: Secondary | ICD-10-CM | POA: Diagnosis not present

## 2016-12-07 LAB — POCT INR: INR: 1.8

## 2016-12-16 ENCOUNTER — Telehealth: Payer: Self-pay | Admitting: Cardiovascular Disease

## 2016-12-16 MED ORDER — DILTIAZEM HCL ER COATED BEADS 180 MG PO CP24: 180.0000 mg | ORAL_CAPSULE | Freq: Every day | ORAL | 1 refills | Status: DC

## 2016-12-16 MED ORDER — WARFARIN SODIUM 7.5 MG PO TABS: ORAL_TABLET | ORAL | 3 refills | Status: AC

## 2016-12-16 MED ORDER — METOPROLOL TARTRATE 100 MG PO TABS: 100.0000 mg | ORAL_TABLET | Freq: Two times a day (BID) | ORAL | 1 refills | Status: DC

## 2016-12-16 MED ORDER — DOXAZOSIN MESYLATE 4 MG PO TABS: 4.0000 mg | ORAL_TABLET | Freq: Every day | ORAL | 1 refills | Status: AC

## 2016-12-16 MED ORDER — LEVOTHYROXINE SODIUM 75 MCG PO TABS: 75.0000 ug | ORAL_TABLET | Freq: Every day | ORAL | 1 refills | Status: DC

## 2016-12-16 MED ORDER — POTASSIUM CHLORIDE CRYS ER 20 MEQ PO TBCR: 20.0000 meq | EXTENDED_RELEASE_TABLET | Freq: Two times a day (BID) | ORAL | 1 refills | Status: DC

## 2016-12-16 MED ORDER — ZOLPIDEM TARTRATE 10 MG PO TABS: 10.0000 mg | ORAL_TABLET | Freq: Every day | ORAL | 1 refills | Status: AC

## 2016-12-16 NOTE — Telephone Encounter (Signed)
New message       *STAT* If patient is at the pharmacy, call can be transferred to refill team.   1. Which medications need to be refilled? (please list name of each medication and dose if known)   levothyroxine (SYNTHROID, LEVOTHROID) 75 MCG tablet Take 75 mcg by mouth daily before breakfast.   metoprolol tartrate (LOPRESSOR) 100 MG tablet Take 100 mg by mouth 2 (two) times    KLOR-CON M20 20 MEQ tablet TAKE 1 TABLET TWICE A DAY   warfarin (COUMADIN) 7.5 MG tablet Take 7.5 mg by mouth daily at 6   doxazosin (CARDURA) 4 MG tablet Take 4 mg by mouth at bedtime.   zolpidem (AMBIEN) 10 MG tablet Take 10 mg by mouth at bedtime.   diltiazem (CARDIZEM CD) 180 MG 24 hr capsule Take 1 capsule (180 mg total) by mouth at bedtime.     2. Which pharmacy/location (including street and city if local pharmacy) is medication to be sent to? [facility] [address]   3. Do they need a 30 day or 90 day supply?  90

## 2016-12-18 NOTE — Progress Notes (Signed)
Cardiology Office Note   Date:  11/07/2016   ID:  Alexander Camacho, DOB 08-Feb-1945, MRN [id number]  PCP:  Patient, No Pcp Per  Cardiologist:   Chilton Siiffany Branford, MD   No chief complaint on file.    History of Present Illness: Alexander Camacho is a 71 y.o. male with chronic diastolic heart failure, severe pulmonary hypertension, paroxysmal atrial fibrillation, hypertension, hypothyroidism, and  who presents for follow-up.  He was hospitalized 10/2016 with acute diastolic heart failure in the setting of atrial fibrillation with rapid ventricular response.  He was previously hospitalized 09/2016 in [place] for pneumonia.   That hospitalization was complicated by atrial fibrillation with rapid ventricular response and acute renal failure requiring short-term dialysis.  This was thought to be from contrast nephropathy after a CT scan.  He remained in atrial fibrillation throughout the hospitalization and was started on warfarin.  He was previously on metoprolol prior to admission and this was continued.  At discharge he had no lower extremity edema and his heart rate was reportedly well-controlled.  He was not aware of being in atrial fibrillation.  He was discharged to a rehab facility and just left there on 10/17.  By the time he left the rehab he had significant lower extremity edema.  After being discharged from rehab he developed worsened shortness of breath.  [facility] where he was noted to be in atrial fibrillation with rapid ventricular response.  An echocardiogram was performed 10/2016 that revealed LVEF 55% with mild aortic regurgitation, mild mitral regurgitation, and a moderately dilated left atrium.  PASP was 63 mmHg.  He was diuresed with IV Lasix and diltiazem was added to his regimen.   Since his last appointment [name] has been feeling well.  At his last appointment [name] remained mildly volume overloaded but was diuresing  well.  His renal function was stable.  His INR has been consistenttly subtherapeutic.  He has been trying to increase his exercise.  He gets 6000 7000 steps daily.  He has also noted that his balance is a little bit off.  He tripped and fell yesterday while walking in the yard.  He did not injure himself.  He also feels stiff after getting up from a seated position.  He denies lightheadedness or dizziness.  In the last couple days his lower extremity edema has worsened.  He does not like wearing compression stockings.  He also notes that he eats too much salt when he eats out at restaurants and he drinks too much fluids in the evenings.  He gets thirsty at night.  He has not noted any chest pain or shortness of breath.  He denies melena or hematochezia.   Past Medical History:  Diagnosis Date  . Acute renal failure (HCC)   . Atrial fibrillation (HCC)   . Blood transfusion without reported diagnosis   . Hypertension   . Pneumonia     Past Surgical History:  Procedure Laterality Date  . APPENDECTOMY       Current Outpatient Medications  Medication Sig Dispense Refill  . acidophilus (RISAQUAD) CAPS capsule Take 1 capsule by mouth daily.    [name]. diltiazem (CARDIZEM CD) 180 MG 24 hr capsule Take 1 capsule (180 mg total) by mouth at bedtime. 30 capsule 1  . doxazosin (CARDURA) 4 MG tablet Take 4 mg by mouth at bedtime.    . ferrous sulfate 325 (65 FE) MG tablet Take 325 mg by mouth  every Monday, Wednesday, Friday, and Saturday at 6 PM.    . furosemide (LASIX) 40 MG tablet Take 1 tablet (40 mg total) by mouth 2 (two) times daily. 60 tablet 1  . gabapentin (NEURONTIN) 100 MG capsule Take 100 mg by mouth 3 (three) times daily.    [name]. levothyroxine (SYNTHROID, LEVOTHROID) 75 MCG tablet Take 75 mcg by mouth daily before breakfast.    . metoprolol tartrate (LOPRESSOR) 100 MG tablet Take 100 mg by mouth 2 (two) times daily.    . potassium chloride SA (K-DUR,KLOR-CON) 20 MEQ tablet Take 1 tablet (20 mEq  total) by mouth 2 (two) times daily. 60 tablet 0  . vancomycin (VANCOCIN) 50 mg/mL oral solution Take 2.5 mLs (125 mg total) by mouth every 6 (six) hours. 150 mL 0  . warfarin (COUMADIN) 7.5 MG tablet Take 7.5 mg by mouth daily at 6 PM.    . zolpidem (AMBIEN) 10 MG tablet Take 10 mg by mouth at bedtime.     No current facility-administered medications for this visit.     Allergies:   Lorazepam and Sulfa antibiotics    Social History:  The patient  reports that  has never smoked. he has never used smokeless tobacco. He reports that he does not drink alcohol or use drugs.   Family History:  The patient's family history is not on file.    ROS:  Please see the history of present illness.   Otherwise, review of systems are positive for none.   All other systems are reviewed and negative.    PHYSICAL EXAM: VS:  BP 125/75   Pulse 78   Ht 5\' 8"  (1.727 m)   Wt 212 lb 12.8 oz (96.5 kg)   BMI 32.36 kg/m  , BMI Body mass index is 32.36 kg/m. GENERAL:  Well appearing HEENT: Pupils equal round and reactive, fundi not visualized, oral mucosa unremarkable NECK:  JVP 2cm above the clavicle at 45 degrees.  Waveform within normal limits, carotid upstroke brisk and symmetric, no bruits, no thyromegaly LUNGS:  Clear to auscultation bilaterally HEART:  Irregularly irregular.  PMI not displaced or sustained,S1 and S2 within normal limits, no S3, no S4, no clicks, no rubs, no murmurs ABD:  Flat, positive bowel sounds normal in frequency in pitch, no bruits, no rebound, no guarding, no midline pulsatile mass, no hepatomegaly, no splenomegaly EXT:  2 plus pulses throughout, 2+ pitting edema to mid tibia bilaterally, no cyanosis no clubbing SKIN:  No rashes no nodules NEURO:  Cranial nerves II through XII grossly intact, motor grossly intact throughout PSYCH:  Cognitively intact, oriented to person place and time  EKG:  EKG is not ordered today. 11/16/16: Atrial fibrillation.  Rate 66 bpm.  L axis  deviation.   Echo 10/28/16: Study Conclusions  - Left ventricle: The cavity size was mildly dilated. There was moderate concentric hypertrophy. Systolic function was normal. The estimated ejection fraction was 55%. Wall motion was normal; there were no regional wall motion abnormalities. The study was not technically sufficient to allow evaluation of LV diastolic dysfunction due to atrial fibrillation. - Aortic valve: Moderately calcified annulus. Trileaflet; mildly thickened, mildly calcified leaflets. There was mild regurgitation. - Mitral valve: Calcified annulus. There was mild regurgitation. - Left atrium: The atrium was moderately dilated. - Pulmonary arteries: PA peak pressure: 63 mm Hg (S). - Pericardium, extracardiac: There was a large left pleural effusion.  Impressions:  - The right ventricular systolic pressure was increased consistent with moderate pulmonary hypertension.  Recent Labs: 10/25/2016: ALT 50; B Natriuretic Peptide 799.0; Magnesium 1.8; TSH 4.788 10/28/2016: BUN 36; Creatinine, Ser 1.42; Hemoglobin 8.8; Platelets 168; Potassium 3.7; Sodium 144  11/07/16: WBC 9, hgb 8.6, hct 26, platelets 313   Lipid Panel No results found for: CHOL, TRIG, HDL, CHOLHDL, VLDL, LDLCALC, LDLDIRECT    Wt Readings from Last 3 Encounters:  10/28/16 98.2 kg (216 lb 8 oz)     ASSESSMENT AND PLAN:  # Persistent atrial fibrillation: Mr. Buxton remains in atrial fibrillation today.  Rates are well-controlled.  He does not have any palpitations and is asymptomatic.  His INR remains subtherapeutic.  It will be rechecked today.  His fatigue seems to be improving.  Given this, there is no urgency to DCCV.  Consider after 3 weeks of therapeutic anticoagulation, as atrial kick may help his diuresis.  He also needs to be evaluated by GI and have a colonoscopy for anemia which is new since his hospitalization.  He prefers to have this performed once he gets back  to [place].  Continue diltiazem, metoprolol and warfarin.  He will establish care with cardiology upon his return next week.   # Acute on chronic diastolic heart failure:  Mr. Trabert's weight has improved but he has increased lower extremity edema.  He will increase Lasix to 80 mg 3 times daily for the next 2 days.  He will also increase his potassium to 20 mEq 3 times daily during this time.  Volume status improving he remains slightly volume overloaded.  We also discussed the importance of limiting fluids to 2L and salt to 2g daily.   # Hypertension: Blood pressure well-controlled.  Continue diltiazem, doxazosin, furosemide, and metoprolol.  # CKD III: Creatinine improved to 1.3 11/2016.   Current medicines are reviewed at length with the patient today.  The patient does not have concerns regarding medicines.  The following changes have been made: none  Labs/ tests ordered today include:  No orders of the defined types were placed in this encounter.    Disposition:   FU with Lauretta Sallas C. Duke Salvia, MD, [facility] as needed.    This note was written with the assistance of speech recognition software.  Please excuse any transcriptional errors.  Signed, Kaliel Bolds C. Duke Salvia, MD, [facility]  11/07/2016 7:38 AM    [facility]

## 2016-12-19 ENCOUNTER — Ambulatory Visit (INDEPENDENT_AMBULATORY_CARE_PROVIDER_SITE_OTHER): Payer: Medicare Other | Admitting: Pharmacist

## 2016-12-19 ENCOUNTER — Ambulatory Visit (INDEPENDENT_AMBULATORY_CARE_PROVIDER_SITE_OTHER): Payer: Medicare Other | Admitting: Cardiovascular Disease

## 2016-12-19 ENCOUNTER — Encounter: Payer: Self-pay | Admitting: Cardiovascular Disease

## 2016-12-19 VITALS — BP 125/75 | HR 78 | Ht 68.0 in | Wt 212.8 lb

## 2016-12-19 DIAGNOSIS — I5043 Acute on chronic combined systolic (congestive) and diastolic (congestive) heart failure: Secondary | ICD-10-CM | POA: Diagnosis not present

## 2016-12-19 DIAGNOSIS — I110 Hypertensive heart disease with heart failure: Secondary | ICD-10-CM | POA: Diagnosis not present

## 2016-12-19 DIAGNOSIS — I481 Persistent atrial fibrillation: Secondary | ICD-10-CM

## 2016-12-19 DIAGNOSIS — D500 Iron deficiency anemia secondary to blood loss (chronic): Secondary | ICD-10-CM

## 2016-12-19 DIAGNOSIS — N183 Chronic kidney disease, stage 3 unspecified: Secondary | ICD-10-CM

## 2016-12-19 DIAGNOSIS — I4819 Other persistent atrial fibrillation: Secondary | ICD-10-CM

## 2016-12-19 DIAGNOSIS — I4891 Unspecified atrial fibrillation: Secondary | ICD-10-CM | POA: Diagnosis not present

## 2016-12-19 DIAGNOSIS — I5033 Acute on chronic diastolic (congestive) heart failure: Secondary | ICD-10-CM

## 2016-12-19 LAB — POCT INR: INR: 3.2

## 2016-12-19 MED ORDER — POTASSIUM CHLORIDE CRYS ER 20 MEQ PO TBCR: 20.0000 meq | EXTENDED_RELEASE_TABLET | Freq: Two times a day (BID) | ORAL | 1 refills | Status: AC

## 2016-12-19 NOTE — Patient Instructions (Signed)
Medication Instructions:  TAKE AND EXTRA FUROSEMIDE AND POTASSIUM TODAY AND TOMORROW   Labwork: NONE  Testing/Procedures: NONE  Follow-Up: ESTABLISH CARE WITH AT CARDIOLOGIST IN [place] WITHIN A MONTH

## 2017-05-06 ENCOUNTER — Other Ambulatory Visit: Payer: Self-pay | Admitting: Cardiovascular Disease

## 2017-05-08 NOTE — Telephone Encounter (Signed)
REFILL 

## 2017-06-09 ENCOUNTER — Other Ambulatory Visit: Payer: Self-pay | Admitting: Cardiovascular Disease

## 2017-07-11 ENCOUNTER — Other Ambulatory Visit: Payer: Self-pay | Admitting: Cardiovascular Disease

## 2017-07-11 NOTE — Telephone Encounter (Signed)
Rx sent to pharmacy   

## 2017-09-07 ENCOUNTER — Other Ambulatory Visit: Payer: Self-pay | Admitting: Cardiovascular Disease

## 2017-09-07 ENCOUNTER — Other Ambulatory Visit: Payer: Self-pay | Admitting: *Deleted

## 2017-11-30 ENCOUNTER — Other Ambulatory Visit: Payer: Self-pay | Admitting: Cardiovascular Disease

## 2017-12-04 NOTE — Telephone Encounter (Signed)
Refill potassium, diltiazem and metoprolol.  Levothyroxine needs to come from PCP.

## 2017-12-11 NOTE — Telephone Encounter (Signed)
Spoke with patient and he asked that these be ignored as he has gotten these from his local cardiologist

## 2019-04-04 IMAGING — CR DG CHEST 2V
2 series · 2 of 2 positions shown · non-contrast
Comparison: None.

CLINICAL DATA: 71-year-old male with shortness of breath.

EXAM:
CHEST  2 VIEW

[w chest lat]
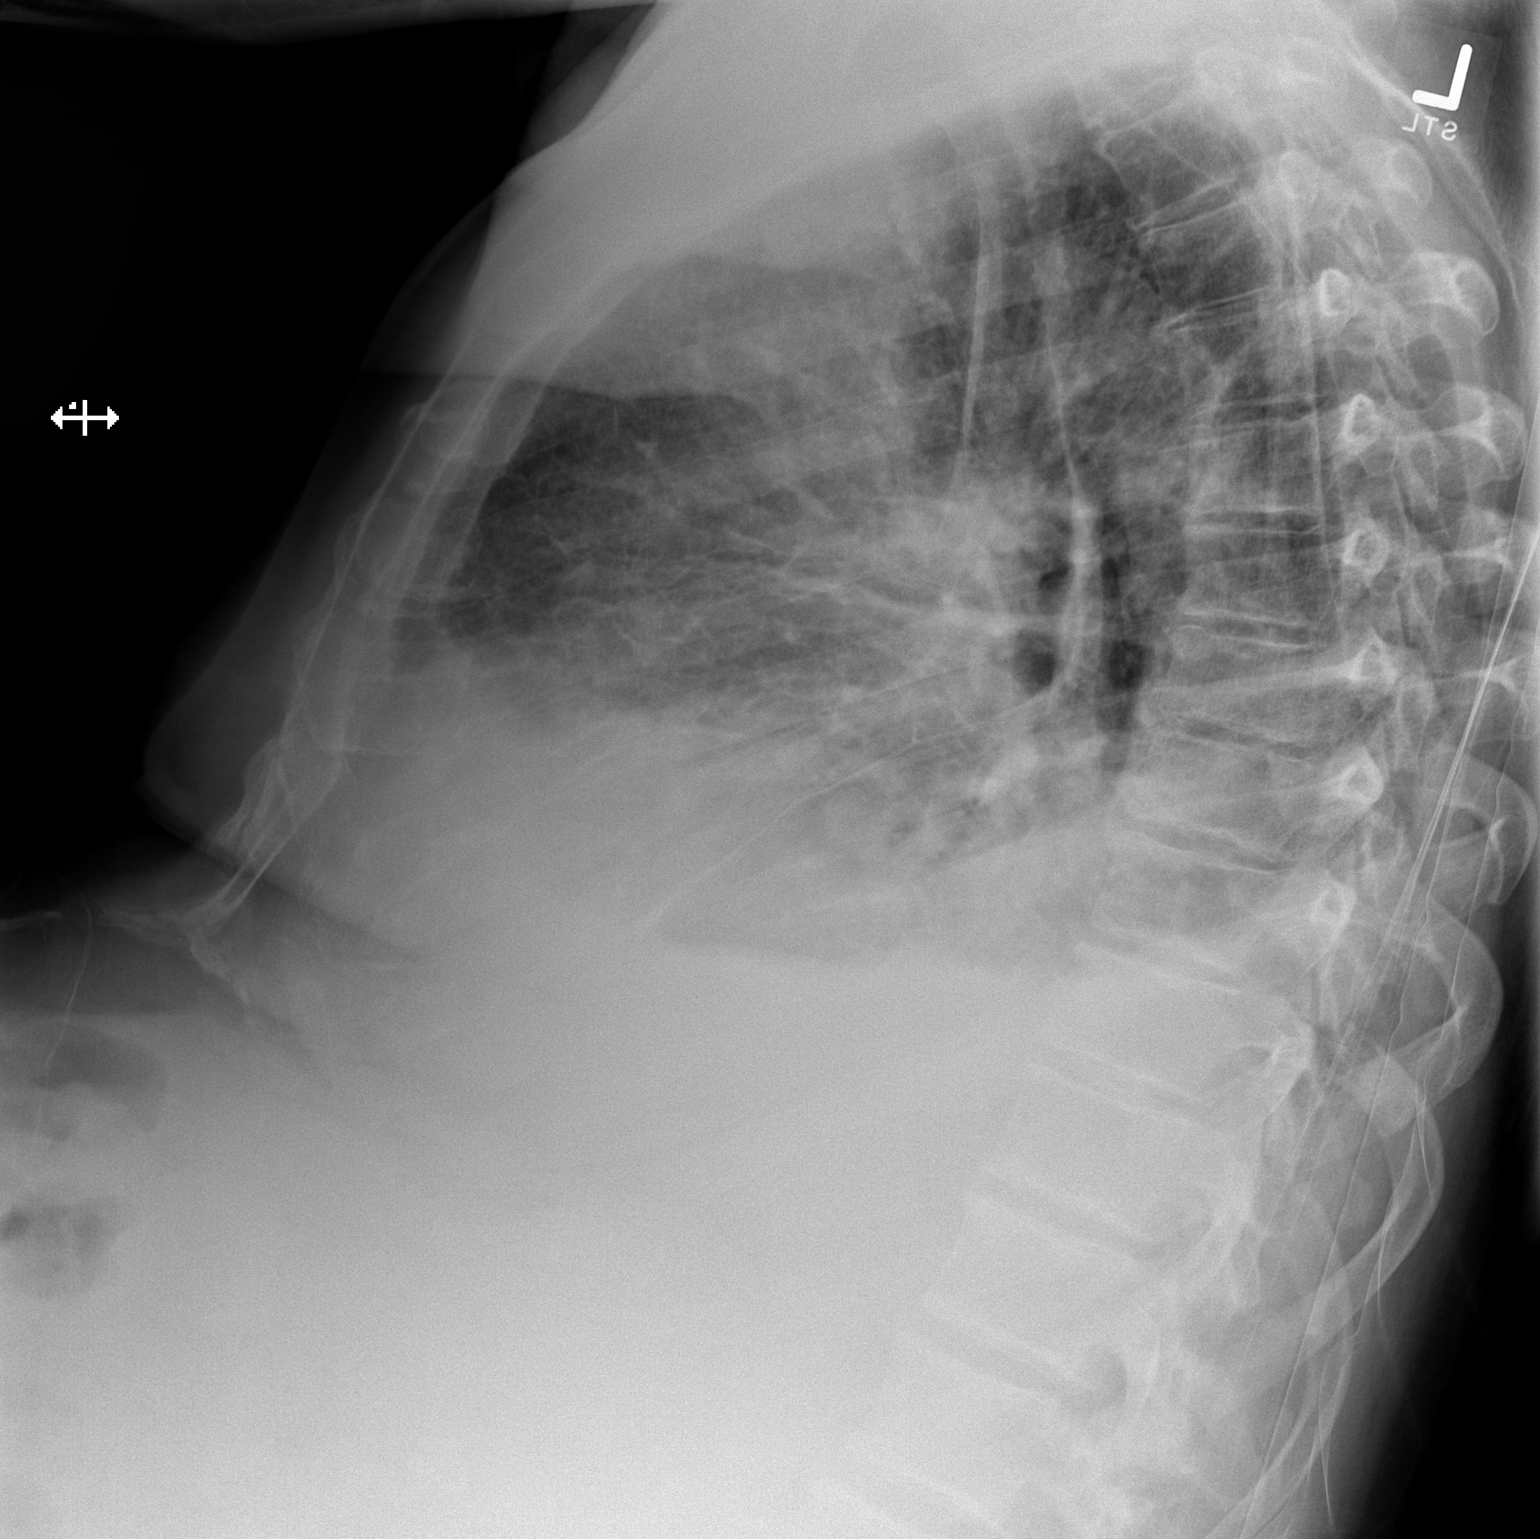

[x chest ap]
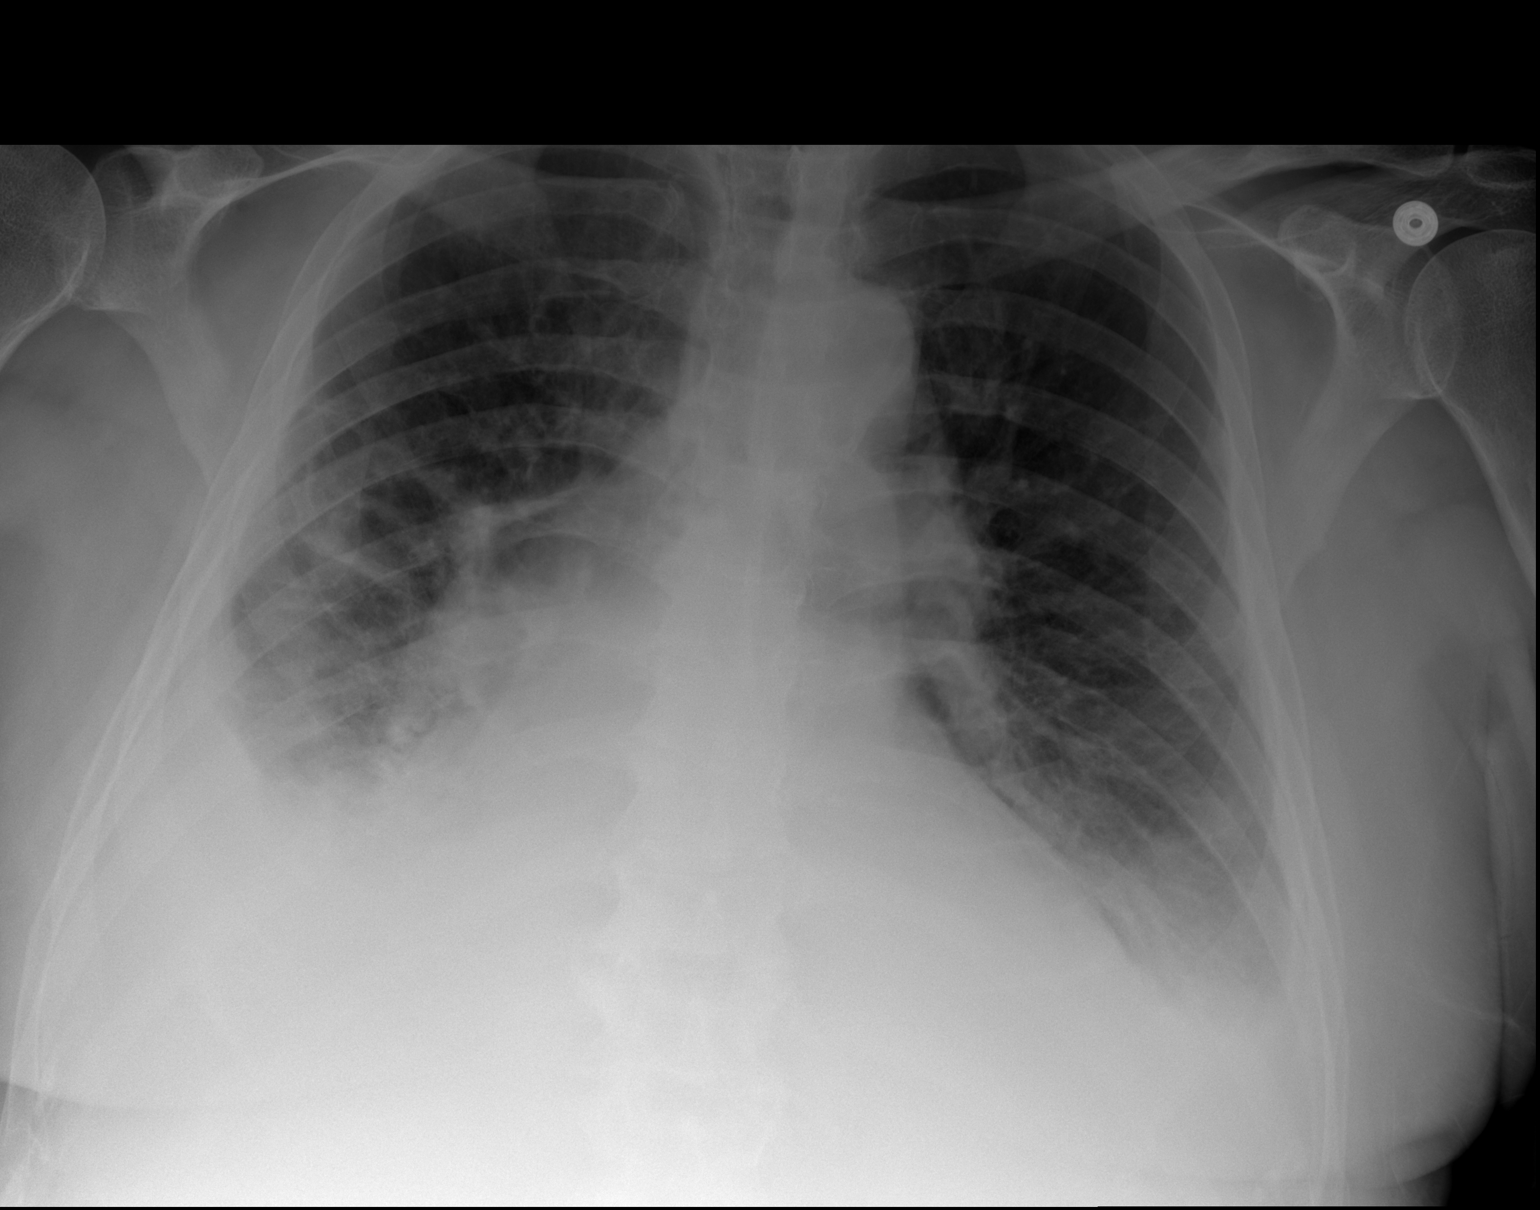

[2 of 2 positions shown; findings below may reference images not displayed]

FINDINGS: There are small bilateral pleural effusions, right greater than
left. There is associated partial compressive atelectasis of the
lower lobes. Superimposed pneumonia is not excluded. Clinical
correlation is recommended. There is no pneumothorax. There is
cardiomegaly. There is silhouetting of the cardiac border by the
pleuroparenchymal densities. No acute osseous pathology.
IMPRESSION: Bilateral pleural effusions and bilateral lower lobe atelectatic
changes versus infiltrate. Clinical correlation is recommended.

## 2019-04-05 IMAGING — DX DG CHEST 2V
3 series · 3 of 3 positions shown · non-contrast
Comparison: 10/25/2016

CLINICAL DATA: Follow-up CHF.

EXAM:
CHEST  2 VIEW

[chest lat (1 of 2)]
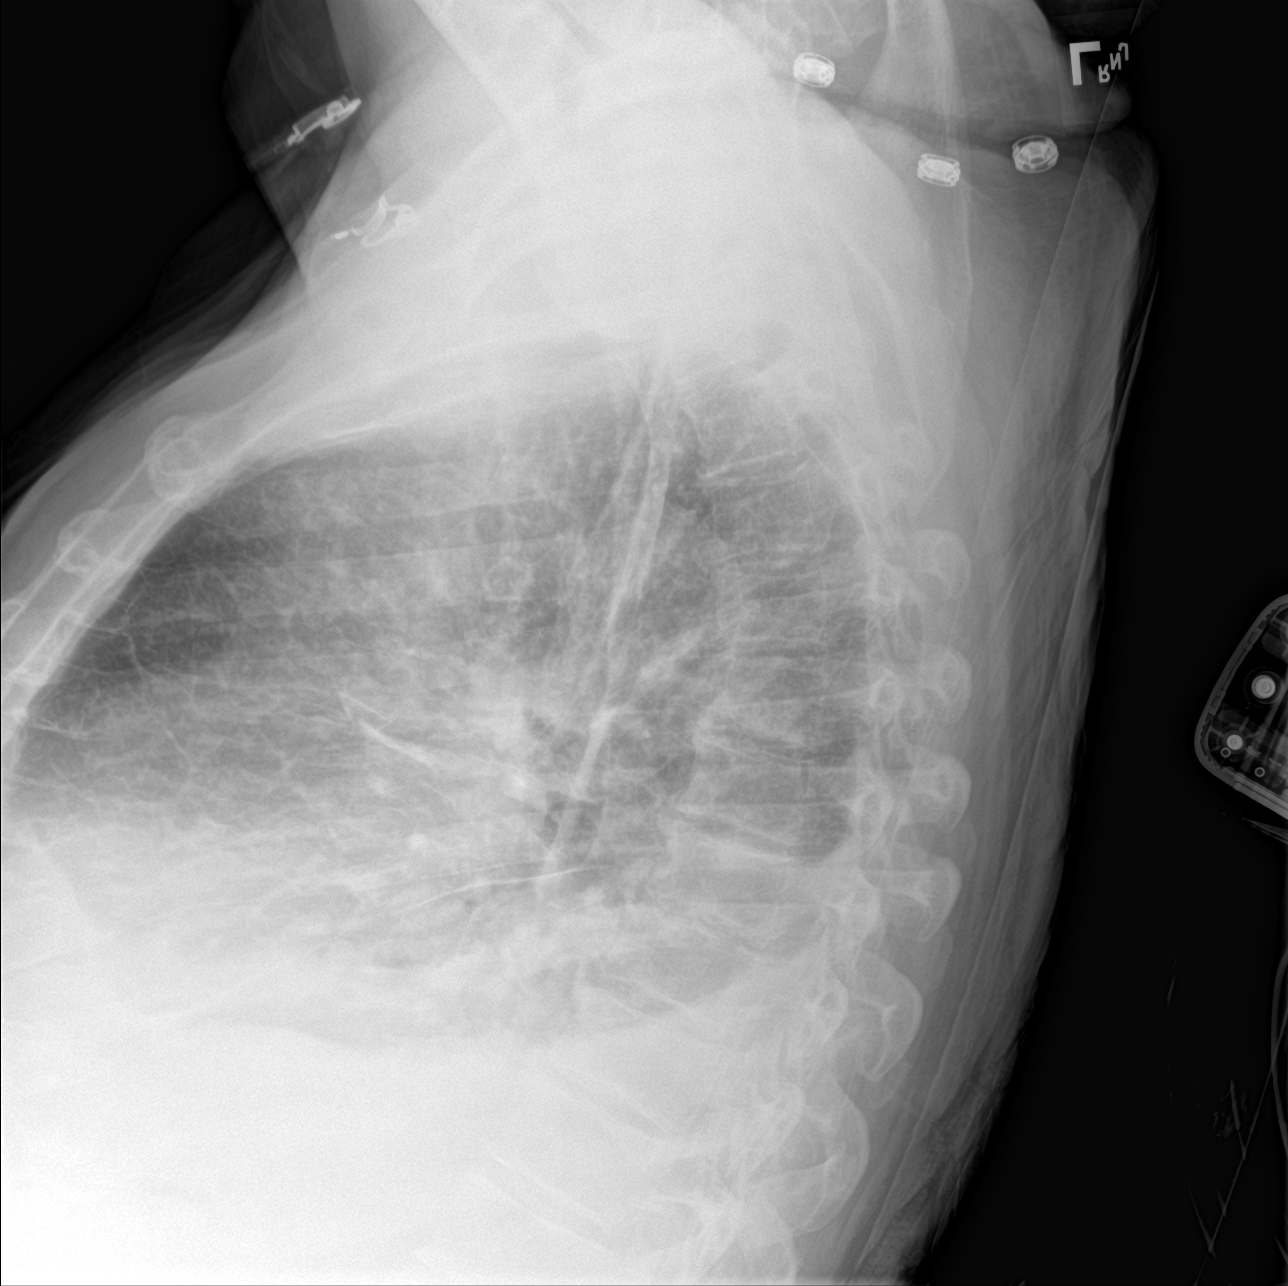

[chest ap]
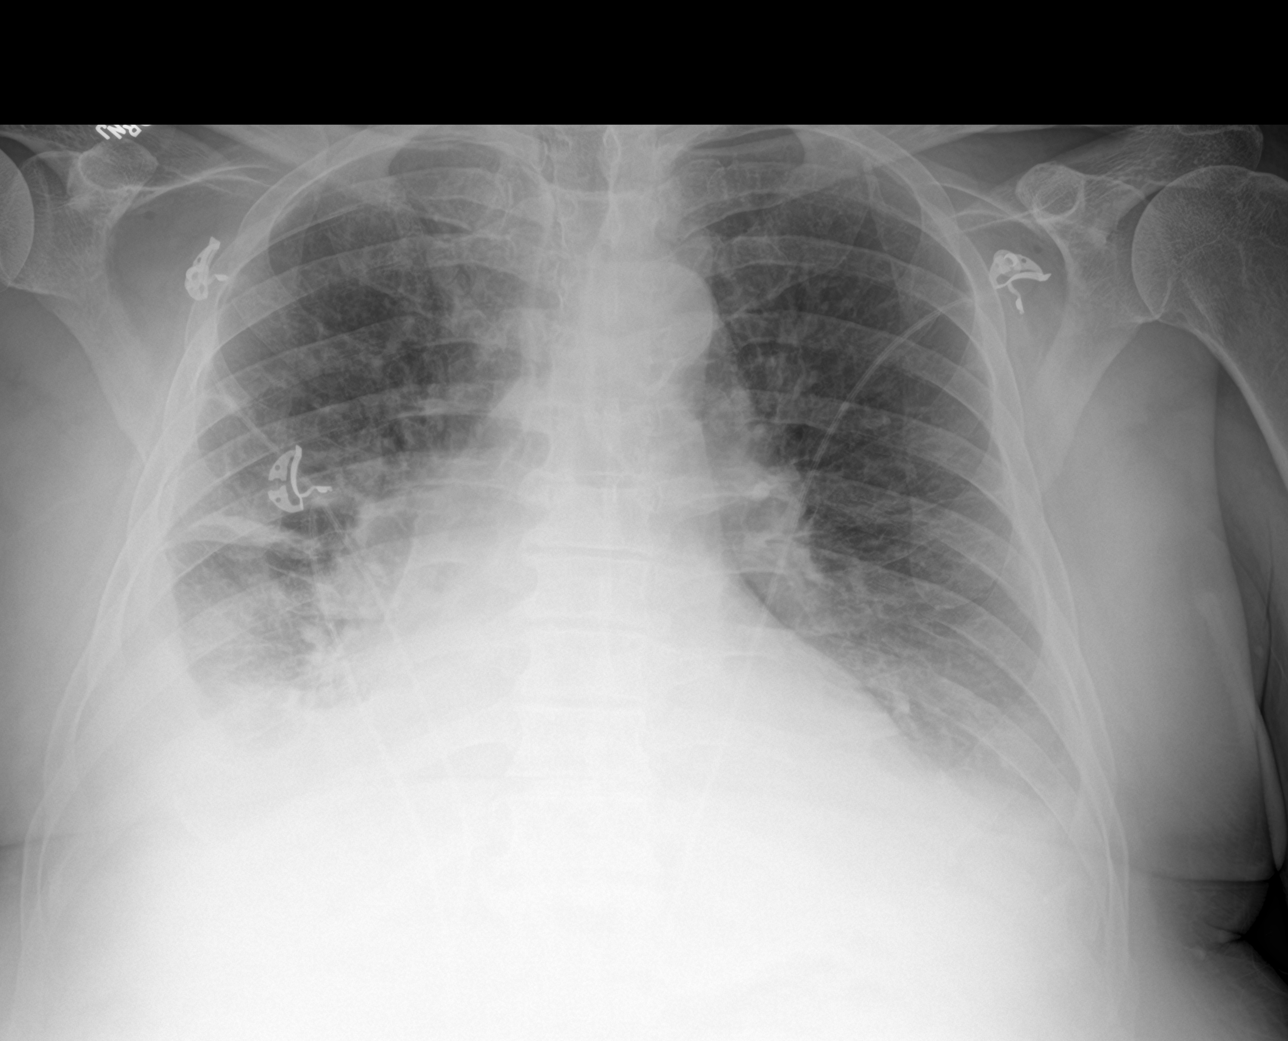

[chest lat (2 of 2)]
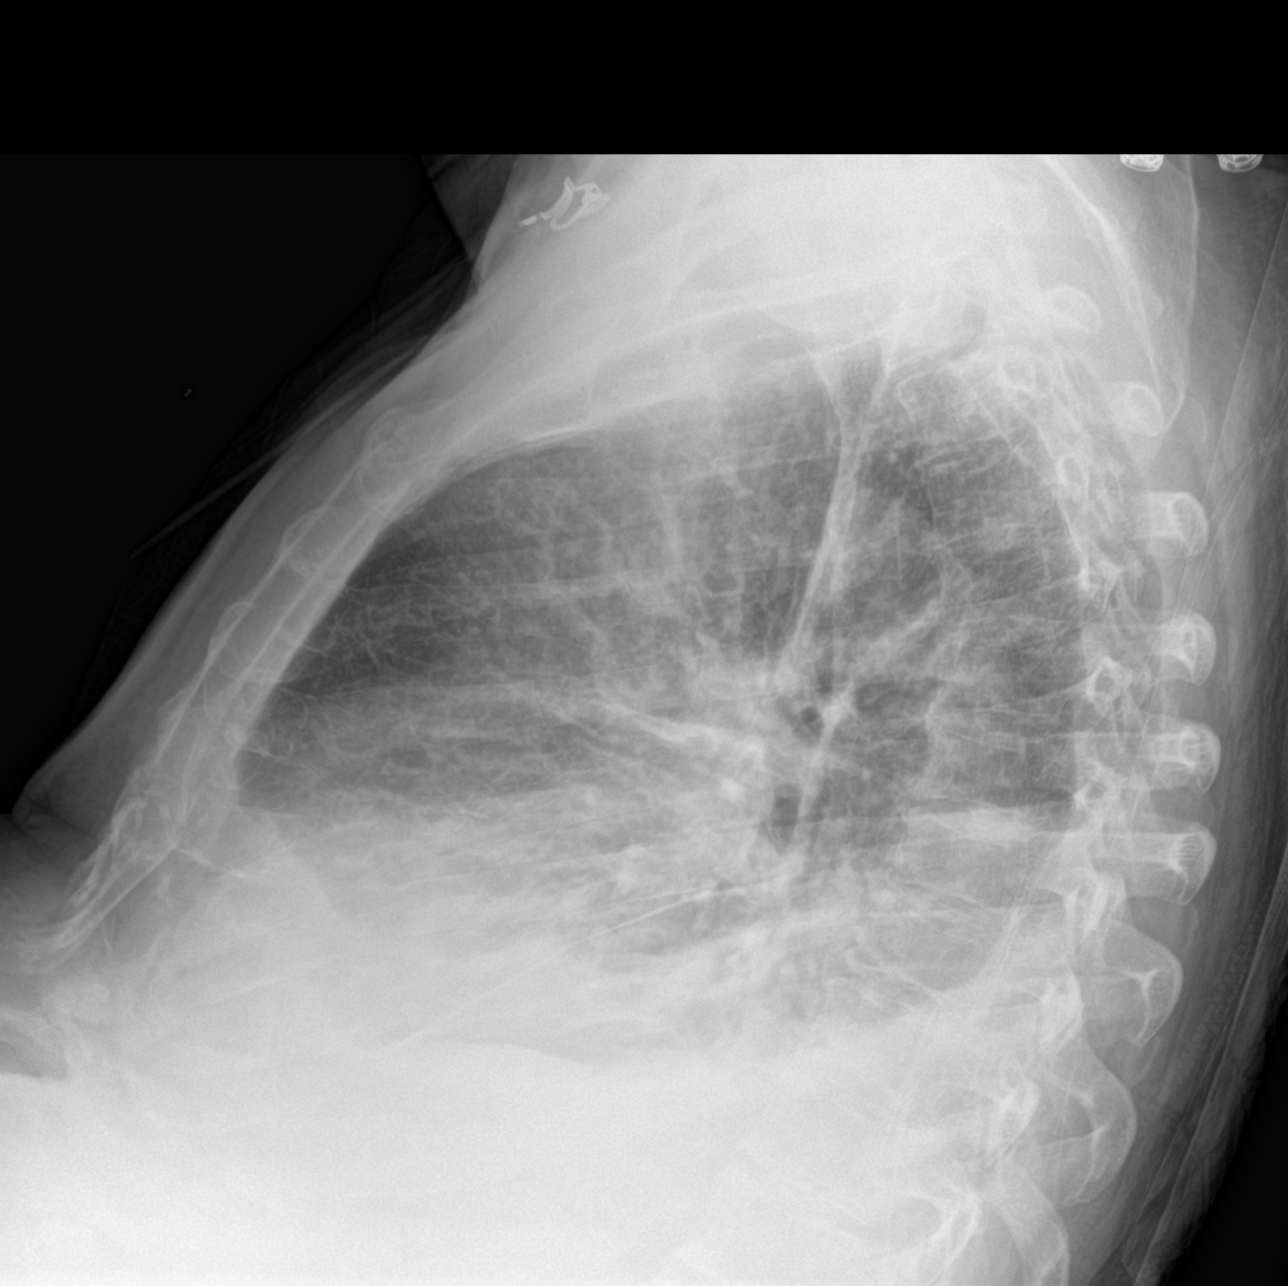

[3 of 3 positions shown; findings below may reference images not displayed]

FINDINGS: Persistent basilar chest densities, right side greater the left.
Right basilar densities are suggestive for combination of
consolidation and pleural fluid. Subsegmental atelectasis and/or
pleural fissure fluid in the mid right chest. Fullness along the
right side of the mediastinum and right cardiac shadow is unchanged.
Interstitial markings are slightly enlarged. Negative for
pneumothorax. The trachea is midline.
IMPRESSION: Persistent bibasilar chest densities, right side greater than left.
Chest densities likely represent a combination of consolidation and
pleural fluid. Right pleural fluid may be loculated based on the
configuration.

Mild fullness along the right side of the cardiac shadow and right
mediastinum. Lymphadenopathy in this area cannot be excluded.
Consider further characterization with a Chest CT with IV contrast.

Interstitial markings are prominent and may represent mild
interstitial edema.
